# Patient Record
Sex: Male | Born: 1960
Health system: Southern US, Community
[De-identification: ages and names within clinical notes are randomized; demographics above are authoritative.]

## PROBLEM LIST (undated history)

## (undated) DIAGNOSIS — L918 Other hypertrophic disorders of the skin: Secondary | ICD-10-CM

## (undated) DIAGNOSIS — Q059 Spina bifida, unspecified: Secondary | ICD-10-CM

## (undated) DIAGNOSIS — I10 Essential (primary) hypertension: Secondary | ICD-10-CM

## (undated) DIAGNOSIS — Z8781 Personal history of (healed) traumatic fracture: Secondary | ICD-10-CM

## (undated) DIAGNOSIS — K219 Gastro-esophageal reflux disease without esophagitis: Secondary | ICD-10-CM

## (undated) DIAGNOSIS — R230 Cyanosis: Secondary | ICD-10-CM

## (undated) DIAGNOSIS — N390 Urinary tract infection, site not specified: Secondary | ICD-10-CM

## (undated) DIAGNOSIS — I7090 Unspecified atherosclerosis: Secondary | ICD-10-CM

## (undated) DIAGNOSIS — R209 Unspecified disturbances of skin sensation: Secondary | ICD-10-CM

## (undated) DIAGNOSIS — K824 Cholesterolosis of gallbladder: Secondary | ICD-10-CM

## (undated) DIAGNOSIS — Z789 Other specified health status: Secondary | ICD-10-CM

## (undated) DIAGNOSIS — R413 Other amnesia: Secondary | ICD-10-CM

## (undated) DIAGNOSIS — N319 Neuromuscular dysfunction of bladder, unspecified: Secondary | ICD-10-CM

## (undated) DIAGNOSIS — Z8669 Personal history of other diseases of the nervous system and sense organs: Secondary | ICD-10-CM

## (undated) HISTORY — PX: UPPER GASTROINTESTINAL ENDOSCOPY: SHX188

## (undated) HISTORY — DX: Urinary tract infection, site not specified: N39.0

## (undated) HISTORY — DX: Personal history of other diseases of the nervous system and sense organs: Z86.69

## (undated) HISTORY — DX: Neuromuscular dysfunction of bladder, unspecified: N31.9

## (undated) HISTORY — DX: Cyanosis: R23.0

## (undated) HISTORY — DX: Spina bifida, unspecified: Q05.9

## (undated) HISTORY — PX: CSF SHUNT: SHX92

## (undated) HISTORY — PX: OTHER SURGICAL HISTORY: SHX169

## (undated) HISTORY — DX: Essential (primary) hypertension: I10

## (undated) HISTORY — DX: Other hypertrophic disorders of the skin: L91.8

## (undated) HISTORY — DX: Gastro-esophageal reflux disease without esophagitis: K21.9

## (undated) HISTORY — DX: Other amnesia: R41.3

## (undated) HISTORY — DX: Unspecified atherosclerosis: I70.90

## (undated) HISTORY — DX: Cholesterolosis of gallbladder: K82.4

## (undated) HISTORY — DX: Unspecified disturbances of skin sensation: R20.9

---

## 1999-01-08 HISTORY — PX: COLONOSCOPY: SHX174

## 1999-12-26 ENCOUNTER — Ambulatory Visit (HOSPITAL_COMMUNITY): Admission: RE | Admit: 1999-12-26 | Discharge: 1999-12-26 | Payer: Self-pay | Admitting: *Deleted

## 2008-03-10 ENCOUNTER — Encounter (INDEPENDENT_AMBULATORY_CARE_PROVIDER_SITE_OTHER): Payer: Self-pay | Admitting: *Deleted

## 2008-03-10 ENCOUNTER — Ambulatory Visit (HOSPITAL_COMMUNITY): Admission: RE | Admit: 2008-03-10 | Discharge: 2008-03-10 | Payer: Self-pay | Admitting: *Deleted

## 2009-05-26 ENCOUNTER — Encounter: Admission: RE | Admit: 2009-05-26 | Discharge: 2009-05-26 | Payer: Self-pay | Admitting: Internal Medicine

## 2009-09-28 ENCOUNTER — Encounter: Admission: RE | Admit: 2009-09-28 | Discharge: 2009-09-28 | Payer: Self-pay | Admitting: Internal Medicine

## 2010-05-22 NOTE — Op Note (Signed)
NAME:  Kenneth Arroyo, Kenneth Arroyo NO.:  [id number]   MEDICAL RECORD NO.:  [id number]          PATIENT TYPE:  AMB   LOCATION:  ENDO                         FACILITY:  [facility]   PHYSICIAN:  Georgiana Spinner, M.D.    DATE OF BIRTH:  [birth year]   DATE OF PROCEDURE:  03/10/2008  DATE OF DISCHARGE:                               OPERATIVE REPORT   PROCEDURE:  Upper endoscopy.   INDICATIONS:  Abdominal pain.   ANESTHESIA:  Fentanyl 75 mcg, Versed 6 mg.   PROCEDURE:  With the patient mildly sedated in the left lateral  decubitus position the Pentax videoscopic endoscope was inserted in the  mouth and passed under direct vision through the esophagus which  appeared normal until we reached distal esophagus and at the junction of  the squamocolumnar border, we saw a small ulcer.  We entered into the  stomach.  The fundus, body, antrum, duodenal bulb, second portion  duodenum were visualized.  From this point the endoscope was slowly  withdrawn taking circumferential views of duodenal mucosa until the  endoscope had been pulled back into stomach, placed in retroflexion to  view the stomach from below.  The endoscope was straightened and  withdrawn taking circumferential views remaining gastric and esophageal  mucosa stopping in the antrum to take biopsies of mildly erythematous  mucosa and also in the fundus of the stomach where a polyp was seen,  photographed and it, too, was biopsied.  We then withdrew back to the  distal esophagus, proximal stomach where the previously noted ulcer was  seen photographed and biopsied.  The endoscope was withdrawn taking  circumferential views of remaining esophageal mucosa.  The patient's  vital signs, pulse oximeter remained stable.  The patient tolerated  procedure well without apparent complications.   FINDINGS:  Mild changes of duodenitis photographed only.  Question of  antritis biopsied.  Gastric polyp biopsied and an ulcer at the  squamocolumnar  junction biopsied.  Await biopsy reports.  The patient  will call me for results and follow-up with me as an outpatient.  Will  start the patient on PPI therapy namely Prilosec OTC once a day.  The  patient states his abdominal pain is somewhat better and will follow up  subsequently.           ______________________________  Georgiana Spinner, M.D.     GMO/MEDQ  D:  03/10/2008  T:  03/10/2008  Job:  [id number]

## 2011-01-11 DIAGNOSIS — B0223 Postherpetic polyneuropathy: Secondary | ICD-10-CM | POA: Diagnosis not present

## 2011-01-11 DIAGNOSIS — H35719 Central serous chorioretinopathy, unspecified eye: Secondary | ICD-10-CM | POA: Diagnosis not present

## 2011-01-16 DIAGNOSIS — D485 Neoplasm of uncertain behavior of skin: Secondary | ICD-10-CM | POA: Diagnosis not present

## 2011-01-16 DIAGNOSIS — L259 Unspecified contact dermatitis, unspecified cause: Secondary | ICD-10-CM | POA: Diagnosis not present

## 2011-02-21 DIAGNOSIS — J019 Acute sinusitis, unspecified: Secondary | ICD-10-CM | POA: Diagnosis not present

## 2011-03-12 DIAGNOSIS — H359 Unspecified retinal disorder: Secondary | ICD-10-CM | POA: Diagnosis not present

## 2011-03-12 DIAGNOSIS — H35719 Central serous chorioretinopathy, unspecified eye: Secondary | ICD-10-CM | POA: Diagnosis not present

## 2011-05-21 DIAGNOSIS — H35719 Central serous chorioretinopathy, unspecified eye: Secondary | ICD-10-CM | POA: Diagnosis not present

## 2011-07-12 DIAGNOSIS — I10 Essential (primary) hypertension: Secondary | ICD-10-CM | POA: Diagnosis not present

## 2011-07-12 DIAGNOSIS — R300 Dysuria: Secondary | ICD-10-CM | POA: Diagnosis not present

## 2011-07-12 DIAGNOSIS — N390 Urinary tract infection, site not specified: Secondary | ICD-10-CM | POA: Diagnosis not present

## 2011-07-19 DIAGNOSIS — N390 Urinary tract infection, site not specified: Secondary | ICD-10-CM | POA: Diagnosis not present

## 2011-07-19 DIAGNOSIS — I10 Essential (primary) hypertension: Secondary | ICD-10-CM | POA: Diagnosis not present

## 2011-07-23 DIAGNOSIS — H35719 Central serous chorioretinopathy, unspecified eye: Secondary | ICD-10-CM | POA: Diagnosis not present

## 2011-07-25 DIAGNOSIS — N390 Urinary tract infection, site not specified: Secondary | ICD-10-CM | POA: Diagnosis not present

## 2011-08-02 DIAGNOSIS — I10 Essential (primary) hypertension: Secondary | ICD-10-CM | POA: Diagnosis not present

## 2011-08-21 DIAGNOSIS — N390 Urinary tract infection, site not specified: Secondary | ICD-10-CM | POA: Diagnosis not present

## 2011-08-21 DIAGNOSIS — R5383 Other fatigue: Secondary | ICD-10-CM | POA: Diagnosis not present

## 2011-08-21 DIAGNOSIS — R5381 Other malaise: Secondary | ICD-10-CM | POA: Diagnosis not present

## 2011-09-04 DIAGNOSIS — N3020 Other chronic cystitis without hematuria: Secondary | ICD-10-CM | POA: Diagnosis not present

## 2011-09-04 DIAGNOSIS — N319 Neuromuscular dysfunction of bladder, unspecified: Secondary | ICD-10-CM | POA: Diagnosis not present

## 2011-09-27 DIAGNOSIS — N319 Neuromuscular dysfunction of bladder, unspecified: Secondary | ICD-10-CM | POA: Diagnosis not present

## 2011-09-27 DIAGNOSIS — N3020 Other chronic cystitis without hematuria: Secondary | ICD-10-CM | POA: Diagnosis not present

## 2011-10-28 DIAGNOSIS — Z79899 Other long term (current) drug therapy: Secondary | ICD-10-CM | POA: Diagnosis not present

## 2011-10-28 DIAGNOSIS — K229 Disease of esophagus, unspecified: Secondary | ICD-10-CM | POA: Diagnosis not present

## 2011-10-28 DIAGNOSIS — I10 Essential (primary) hypertension: Secondary | ICD-10-CM | POA: Diagnosis not present

## 2011-10-28 DIAGNOSIS — N390 Urinary tract infection, site not specified: Secondary | ICD-10-CM | POA: Diagnosis not present

## 2011-11-01 ENCOUNTER — Other Ambulatory Visit: Payer: Self-pay | Admitting: Internal Medicine

## 2011-11-01 DIAGNOSIS — Q059 Spina bifida, unspecified: Secondary | ICD-10-CM | POA: Diagnosis not present

## 2011-11-01 DIAGNOSIS — L570 Actinic keratosis: Secondary | ICD-10-CM | POA: Diagnosis not present

## 2011-11-01 DIAGNOSIS — N319 Neuromuscular dysfunction of bladder, unspecified: Secondary | ICD-10-CM | POA: Diagnosis not present

## 2011-11-01 DIAGNOSIS — K219 Gastro-esophageal reflux disease without esophagitis: Secondary | ICD-10-CM | POA: Diagnosis not present

## 2011-11-01 DIAGNOSIS — R230 Cyanosis: Secondary | ICD-10-CM

## 2011-11-01 DIAGNOSIS — I10 Essential (primary) hypertension: Secondary | ICD-10-CM | POA: Diagnosis not present

## 2011-11-08 DIAGNOSIS — I10 Essential (primary) hypertension: Secondary | ICD-10-CM | POA: Diagnosis not present

## 2011-11-08 DIAGNOSIS — R209 Unspecified disturbances of skin sensation: Secondary | ICD-10-CM

## 2011-11-08 HISTORY — DX: Unspecified disturbances of skin sensation: R20.9

## 2011-11-12 ENCOUNTER — Ambulatory Visit
Admission: RE | Admit: 2011-11-12 | Discharge: 2011-11-12 | Disposition: A | Discharge status: Home / Self Care | Payer: Medicare Other | Source: Ambulatory Visit | Attending: Internal Medicine | Admitting: Internal Medicine

## 2011-11-12 DIAGNOSIS — I70229 Atherosclerosis of native arteries of extremities with rest pain, unspecified extremity: Secondary | ICD-10-CM | POA: Diagnosis not present

## 2011-11-12 DIAGNOSIS — R230 Cyanosis: Secondary | ICD-10-CM

## 2011-11-20 DIAGNOSIS — I10 Essential (primary) hypertension: Secondary | ICD-10-CM | POA: Diagnosis not present

## 2011-12-09 ENCOUNTER — Encounter: Payer: Self-pay | Admitting: Vascular Surgery

## 2011-12-17 ENCOUNTER — Encounter: Payer: Self-pay | Admitting: Vascular Surgery

## 2011-12-18 ENCOUNTER — Encounter: Payer: Medicare Other | Admitting: Vascular Surgery

## 2011-12-18 ENCOUNTER — Encounter: Payer: Self-pay | Admitting: Vascular Surgery

## 2011-12-18 ENCOUNTER — Ambulatory Visit (INDEPENDENT_AMBULATORY_CARE_PROVIDER_SITE_OTHER): Payer: Medicare Other | Admitting: Vascular Surgery

## 2011-12-18 VITALS — BP 120/79 | HR 58 | Ht 60.0 in | Wt 170.0 lb

## 2011-12-18 DIAGNOSIS — I739 Peripheral vascular disease, unspecified: Secondary | ICD-10-CM | POA: Diagnosis not present

## 2011-12-18 NOTE — Assessment & Plan Note (Signed)
Based on his Doppler study he does have evidence of bilateral infrainguinal arterial occlusive disease. However, he has only moderate disease and has no wounds on his feet. He is nonambulatory with no sensation in his legs. I would only favor arteriography and consideration for revascularization in his situation if he developed a nonhealing wound on his foot. I've encouraged him to continue taking care of his feet and pulled him that we will see him back at any time if any new vascular issues arise.

## 2011-12-18 NOTE — Progress Notes (Signed)
Vascular and Vein Specialist of [facility]  Patient name: Kenneth Arroyo MRN: [id number] DOB: 10/29/1960 Sex: male  REASON FOR CONSULT: evaluate for peripheral vascular disease  HPI: Kenneth Arroyo is a 51 y.o. male with a history of spina bifida. He is paraplegic. He has no sensation in his legs and is nonambulatory. He has had no history of wounds on his feet. He was noted to have some mottling in his lower extremities and therefore was sent for vascular consultation.  I have reviewed his records from Dr. Carolee Rota office. He does have a history of neurogenic bladder and frequent urinary tract infections. He also has essential hypertension which has been under good control and gastroesophageal reflux disease.  Past Medical History  Diagnosis Date  . Arterial occlusive disease   . Neurogenic bladder, NOS   . Esophageal reflux   . Hypertension   . Spina bifida without mention of hydrocephalus, unspecified region   . UTI (lower urinary tract infection)   . Cyanosis   . Cutaneous skin tags   . History of paraplegia     Secondary to spina bifida   . Cold feet Nov 2013    Bilateral cold feet , discolored.  [place] bladder polyp     Family History  Problem Relation Age of Onset  . Alzheimer's disease Mother   . Hyperlipidemia Mother   . Other Sister     varicose veins    SOCIAL HISTORY: History  Substance Use Topics  . Smoking status: Never Smoker   . Smokeless tobacco: Not on file  . Alcohol Use: No    Allergies  Allergen Reactions  . Amlodipine Swelling  . Amoxicillin Rash  . Doxycycline Rash  . Sulfa Antibiotics Rash    Current Outpatient Prescriptions  Medication Sig Dispense Refill  . atenolol (TENORMIN) 25 MG tablet       . cefUROXime (CEFTIN) 250 MG tablet Take 250 mg by mouth 2 (two) times daily.      . hydrochlorothiazide (HYDRODIURIL) 25 MG tablet       . losartan (COZAAR) 100 MG tablet       . omeprazole (PRILOSEC) 20 MG capsule       . nitrofurantoin  (FURADANTIN) 25 MG/5ML suspension Take 50 mg by mouth 4 (four) times daily.      . nitrofurantoin (MACRODANTIN) 100 MG capsule Take 100 mg by mouth 4 (four) times daily.        REVIEW OF SYSTEMS: Arly.Keller ] denotes positive finding; [  ] denotes negative finding  CARDIOVASCULAR:  [ ]  chest pain   [ ]  chest pressure   [ ]  palpitations   [ ]  orthopnea   [ ]  dyspnea on exertion   [ ]  claudication   [ ]  rest pain   [ ]  DVT   [ ]  phlebitis PULMONARY:   [ ]  productive cough   [ ]  asthma   [ ]  wheezing NEUROLOGIC:   [ ]  weakness  [ ]  paresthesias  [ ]  aphasia  [ ]  amaurosis  [ ]  dizziness HEMATOLOGIC:   [ ]  bleeding problems   [ ]  clotting disorders MUSCULOSKELETAL:  [ ]  joint pain   [ ]  joint swelling Arly.Keller ] leg swelling GASTROINTESTINAL: [ ]   blood in stool  [ ]   hematemesis GENITOURINARY:  [ ]   dysuria  [ ]   hematuria PSYCHIATRIC:  [ ]  history of major depression INTEGUMENTARY:  [ ]  rashes  [ ]  ulcers CONSTITUTIONAL:  [ ]  fever   [ ]   chills  PHYSICAL EXAM: Filed Vitals:   12/18/11 0959  BP: 120/79  Pulse: 58  Height: 5' (1.524 m)  Weight: 170 lb (77.111 kg)  SpO2: 95%   Body mass index is 33.20 kg/(m^2). GENERAL: The patient is a well-nourished male, in no acute distress. The vital signs are documented above. CARDIOVASCULAR: There is a regular rate and rhythm. I do not detect carotid bruits. He has palpable femoral pulses. I cannot palpate pedal pulses. His feet appear adequately perfused. Both feet are somewhat cool. Currently he has no significant lower extremity swelling. PULMONARY: There is good air exchange bilaterally without wheezing or rales. ABDOMEN: Soft and non-tender with normal pitched bowel sounds.  MUSCULOSKELETAL: There are no major deformities or cyanosis. NEUROLOGIC: No focal weakness or paresthesias are detected. SKIN: There are no ulcers or rashes noted. PSYCHIATRIC: The patient has a normal affect.  DATA:  I have reviewed his arterial Doppler study that was done on  11/12/2011. This shows triphasic waveforms in both femoral arteries. He has an ABI of 74% on the right and 82% on the left.  MEDICAL ISSUES: Peripheral vascular disease, unspecified Based on his Doppler study he does have evidence of bilateral infrainguinal arterial occlusive disease. However, he has only moderate disease and has no wounds on his feet. He is nonambulatory with no sensation in his legs. I would only favor arteriography and consideration for revascularization in his situation if he developed a nonhealing wound on his foot. I've encouraged him to continue taking care of his feet and pulled him that we will see him back at any time if any new vascular issues arise.   Corinna Burkman S [facility] Vein Specialists of [place] [facility]: [phone]

## 2012-03-18 DIAGNOSIS — J329 Chronic sinusitis, unspecified: Secondary | ICD-10-CM | POA: Diagnosis not present

## 2012-04-02 DIAGNOSIS — N319 Neuromuscular dysfunction of bladder, unspecified: Secondary | ICD-10-CM | POA: Diagnosis not present

## 2012-04-02 DIAGNOSIS — N3020 Other chronic cystitis without hematuria: Secondary | ICD-10-CM | POA: Diagnosis not present

## 2012-05-12 DIAGNOSIS — M7710 Lateral epicondylitis, unspecified elbow: Secondary | ICD-10-CM | POA: Diagnosis not present

## 2012-05-12 DIAGNOSIS — R079 Chest pain, unspecified: Secondary | ICD-10-CM | POA: Diagnosis not present

## 2012-05-12 DIAGNOSIS — I10 Essential (primary) hypertension: Secondary | ICD-10-CM | POA: Diagnosis not present

## 2012-08-10 DIAGNOSIS — L255 Unspecified contact dermatitis due to plants, except food: Secondary | ICD-10-CM | POA: Diagnosis not present

## 2013-05-18 DIAGNOSIS — L259 Unspecified contact dermatitis, unspecified cause: Secondary | ICD-10-CM | POA: Diagnosis not present

## 2013-08-19 DIAGNOSIS — J069 Acute upper respiratory infection, unspecified: Secondary | ICD-10-CM | POA: Diagnosis not present

## 2013-08-19 DIAGNOSIS — R079 Chest pain, unspecified: Secondary | ICD-10-CM | POA: Diagnosis not present

## 2013-09-15 ENCOUNTER — Other Ambulatory Visit (HOSPITAL_COMMUNITY): Payer: Self-pay | Admitting: Adult Health

## 2013-09-15 ENCOUNTER — Ambulatory Visit (HOSPITAL_COMMUNITY)
Admission: RE | Admit: 2013-09-15 | Discharge: 2013-09-15 | Disposition: A | Discharge status: Home / Self Care | Payer: Medicare Other | Source: Ambulatory Visit | Attending: Adult Health | Admitting: Adult Health

## 2013-09-15 DIAGNOSIS — N3020 Other chronic cystitis without hematuria: Secondary | ICD-10-CM | POA: Diagnosis not present

## 2013-09-15 DIAGNOSIS — M439 Deforming dorsopathy, unspecified: Secondary | ICD-10-CM | POA: Diagnosis not present

## 2013-09-15 DIAGNOSIS — R0781 Pleurodynia: Secondary | ICD-10-CM

## 2013-09-15 DIAGNOSIS — R079 Chest pain, unspecified: Secondary | ICD-10-CM | POA: Diagnosis not present

## 2013-09-15 DIAGNOSIS — I517 Cardiomegaly: Secondary | ICD-10-CM | POA: Insufficient documentation

## 2013-09-15 DIAGNOSIS — Q766 Other congenital malformations of ribs: Secondary | ICD-10-CM | POA: Insufficient documentation

## 2013-09-15 DIAGNOSIS — Q767 Congenital malformation of sternum: Secondary | ICD-10-CM | POA: Insufficient documentation

## 2013-09-15 DIAGNOSIS — N319 Neuromuscular dysfunction of bladder, unspecified: Secondary | ICD-10-CM | POA: Diagnosis not present

## 2013-10-08 DIAGNOSIS — N3020 Other chronic cystitis without hematuria: Secondary | ICD-10-CM | POA: Diagnosis not present

## 2013-10-08 DIAGNOSIS — N319 Neuromuscular dysfunction of bladder, unspecified: Secondary | ICD-10-CM | POA: Diagnosis not present

## 2013-11-10 DIAGNOSIS — Z125 Encounter for screening for malignant neoplasm of prostate: Secondary | ICD-10-CM | POA: Diagnosis not present

## 2013-11-10 DIAGNOSIS — I10 Essential (primary) hypertension: Secondary | ICD-10-CM | POA: Diagnosis not present

## 2013-11-16 DIAGNOSIS — E780 Pure hypercholesterolemia: Secondary | ICD-10-CM | POA: Diagnosis not present

## 2013-11-16 DIAGNOSIS — E786 Lipoprotein deficiency: Secondary | ICD-10-CM | POA: Diagnosis not present

## 2013-11-16 DIAGNOSIS — R7301 Impaired fasting glucose: Secondary | ICD-10-CM | POA: Diagnosis not present

## 2013-11-16 DIAGNOSIS — I10 Essential (primary) hypertension: Secondary | ICD-10-CM | POA: Diagnosis not present

## 2013-11-16 DIAGNOSIS — J329 Chronic sinusitis, unspecified: Secondary | ICD-10-CM | POA: Diagnosis not present

## 2013-11-19 DIAGNOSIS — E876 Hypokalemia: Secondary | ICD-10-CM | POA: Diagnosis not present

## 2013-11-26 DIAGNOSIS — E876 Hypokalemia: Secondary | ICD-10-CM | POA: Diagnosis not present

## 2014-05-17 DIAGNOSIS — E780 Pure hypercholesterolemia: Secondary | ICD-10-CM | POA: Diagnosis not present

## 2014-05-23 DIAGNOSIS — R748 Abnormal levels of other serum enzymes: Secondary | ICD-10-CM | POA: Diagnosis not present

## 2014-05-23 DIAGNOSIS — I10 Essential (primary) hypertension: Secondary | ICD-10-CM | POA: Diagnosis not present

## 2014-05-23 DIAGNOSIS — L309 Dermatitis, unspecified: Secondary | ICD-10-CM | POA: Diagnosis not present

## 2014-10-11 DIAGNOSIS — N319 Neuromuscular dysfunction of bladder, unspecified: Secondary | ICD-10-CM | POA: Diagnosis not present

## 2014-10-11 DIAGNOSIS — N3020 Other chronic cystitis without hematuria: Secondary | ICD-10-CM | POA: Diagnosis not present

## 2014-11-16 DIAGNOSIS — Z125 Encounter for screening for malignant neoplasm of prostate: Secondary | ICD-10-CM | POA: Diagnosis not present

## 2014-11-16 DIAGNOSIS — N390 Urinary tract infection, site not specified: Secondary | ICD-10-CM | POA: Diagnosis not present

## 2014-11-16 DIAGNOSIS — E7800 Pure hypercholesterolemia, unspecified: Secondary | ICD-10-CM | POA: Diagnosis not present

## 2014-11-16 DIAGNOSIS — Z0001 Encounter for general adult medical examination with abnormal findings: Secondary | ICD-10-CM | POA: Diagnosis not present

## 2014-11-16 DIAGNOSIS — I10 Essential (primary) hypertension: Secondary | ICD-10-CM | POA: Diagnosis not present

## 2014-11-22 DIAGNOSIS — I10 Essential (primary) hypertension: Secondary | ICD-10-CM | POA: Diagnosis not present

## 2014-11-22 DIAGNOSIS — H6120 Impacted cerumen, unspecified ear: Secondary | ICD-10-CM | POA: Diagnosis not present

## 2014-11-22 DIAGNOSIS — Z0000 Encounter for general adult medical examination without abnormal findings: Secondary | ICD-10-CM | POA: Diagnosis not present

## 2014-11-22 DIAGNOSIS — E876 Hypokalemia: Secondary | ICD-10-CM | POA: Diagnosis not present

## 2014-11-22 DIAGNOSIS — L570 Actinic keratosis: Secondary | ICD-10-CM | POA: Diagnosis not present

## 2014-11-22 DIAGNOSIS — N319 Neuromuscular dysfunction of bladder, unspecified: Secondary | ICD-10-CM | POA: Diagnosis not present

## 2014-11-22 DIAGNOSIS — K219 Gastro-esophageal reflux disease without esophagitis: Secondary | ICD-10-CM | POA: Diagnosis not present

## 2014-12-06 DIAGNOSIS — I10 Essential (primary) hypertension: Secondary | ICD-10-CM | POA: Diagnosis not present

## 2014-12-06 DIAGNOSIS — E876 Hypokalemia: Secondary | ICD-10-CM | POA: Diagnosis not present

## 2015-05-29 DIAGNOSIS — J329 Chronic sinusitis, unspecified: Secondary | ICD-10-CM | POA: Diagnosis not present

## 2015-05-29 DIAGNOSIS — I10 Essential (primary) hypertension: Secondary | ICD-10-CM | POA: Diagnosis not present

## 2015-12-29 DIAGNOSIS — I10 Essential (primary) hypertension: Secondary | ICD-10-CM | POA: Diagnosis not present

## 2015-12-29 DIAGNOSIS — Z125 Encounter for screening for malignant neoplasm of prostate: Secondary | ICD-10-CM | POA: Diagnosis not present

## 2015-12-29 DIAGNOSIS — N390 Urinary tract infection, site not specified: Secondary | ICD-10-CM | POA: Diagnosis not present

## 2015-12-29 DIAGNOSIS — Z0001 Encounter for general adult medical examination with abnormal findings: Secondary | ICD-10-CM | POA: Diagnosis not present

## 2015-12-29 DIAGNOSIS — E7800 Pure hypercholesterolemia, unspecified: Secondary | ICD-10-CM | POA: Diagnosis not present

## 2016-01-04 DIAGNOSIS — I10 Essential (primary) hypertension: Secondary | ICD-10-CM | POA: Diagnosis not present

## 2016-01-04 DIAGNOSIS — K219 Gastro-esophageal reflux disease without esophagitis: Secondary | ICD-10-CM | POA: Diagnosis not present

## 2016-01-04 DIAGNOSIS — N390 Urinary tract infection, site not specified: Secondary | ICD-10-CM | POA: Diagnosis not present

## 2016-01-04 DIAGNOSIS — K089 Disorder of teeth and supporting structures, unspecified: Secondary | ICD-10-CM | POA: Diagnosis not present

## 2016-04-02 DIAGNOSIS — J329 Chronic sinusitis, unspecified: Secondary | ICD-10-CM | POA: Diagnosis not present

## 2016-04-15 DIAGNOSIS — L03012 Cellulitis of left finger: Secondary | ICD-10-CM | POA: Diagnosis not present

## 2016-04-19 DIAGNOSIS — L03012 Cellulitis of left finger: Secondary | ICD-10-CM | POA: Diagnosis not present

## 2016-04-26 DIAGNOSIS — L03012 Cellulitis of left finger: Secondary | ICD-10-CM | POA: Diagnosis not present

## 2016-06-18 DIAGNOSIS — M2550 Pain in unspecified joint: Secondary | ICD-10-CM | POA: Diagnosis not present

## 2016-06-18 DIAGNOSIS — Z9359 Other cystostomy status: Secondary | ICD-10-CM | POA: Diagnosis not present

## 2016-06-18 DIAGNOSIS — F329 Major depressive disorder, single episode, unspecified: Secondary | ICD-10-CM | POA: Diagnosis not present

## 2016-06-18 DIAGNOSIS — R5383 Other fatigue: Secondary | ICD-10-CM | POA: Diagnosis not present

## 2016-07-05 DIAGNOSIS — I10 Essential (primary) hypertension: Secondary | ICD-10-CM | POA: Diagnosis not present

## 2016-07-05 DIAGNOSIS — K219 Gastro-esophageal reflux disease without esophagitis: Secondary | ICD-10-CM | POA: Diagnosis not present

## 2016-07-05 DIAGNOSIS — E039 Hypothyroidism, unspecified: Secondary | ICD-10-CM | POA: Diagnosis not present

## 2016-09-02 DIAGNOSIS — R5383 Other fatigue: Secondary | ICD-10-CM | POA: Diagnosis not present

## 2016-11-05 DIAGNOSIS — S5002XA Contusion of left elbow, initial encounter: Secondary | ICD-10-CM | POA: Diagnosis not present

## 2016-11-05 DIAGNOSIS — M25522 Pain in left elbow: Secondary | ICD-10-CM | POA: Diagnosis not present

## 2016-11-11 DIAGNOSIS — M25522 Pain in left elbow: Secondary | ICD-10-CM | POA: Diagnosis not present

## 2017-01-02 DIAGNOSIS — I10 Essential (primary) hypertension: Secondary | ICD-10-CM | POA: Diagnosis not present

## 2017-01-02 DIAGNOSIS — E7800 Pure hypercholesterolemia, unspecified: Secondary | ICD-10-CM | POA: Diagnosis not present

## 2017-01-02 DIAGNOSIS — Z125 Encounter for screening for malignant neoplasm of prostate: Secondary | ICD-10-CM | POA: Diagnosis not present

## 2017-01-02 DIAGNOSIS — N390 Urinary tract infection, site not specified: Secondary | ICD-10-CM | POA: Diagnosis not present

## 2017-01-03 DIAGNOSIS — E7800 Pure hypercholesterolemia, unspecified: Secondary | ICD-10-CM | POA: Diagnosis not present

## 2017-01-03 DIAGNOSIS — Z125 Encounter for screening for malignant neoplasm of prostate: Secondary | ICD-10-CM | POA: Diagnosis not present

## 2017-01-03 DIAGNOSIS — I10 Essential (primary) hypertension: Secondary | ICD-10-CM | POA: Diagnosis not present

## 2017-01-06 DIAGNOSIS — Q059 Spina bifida, unspecified: Secondary | ICD-10-CM | POA: Diagnosis not present

## 2017-01-06 DIAGNOSIS — E7800 Pure hypercholesterolemia, unspecified: Secondary | ICD-10-CM | POA: Diagnosis not present

## 2017-01-06 DIAGNOSIS — I10 Essential (primary) hypertension: Secondary | ICD-10-CM | POA: Diagnosis not present

## 2017-01-06 DIAGNOSIS — N390 Urinary tract infection, site not specified: Secondary | ICD-10-CM | POA: Diagnosis not present

## 2017-01-06 DIAGNOSIS — K219 Gastro-esophageal reflux disease without esophagitis: Secondary | ICD-10-CM | POA: Diagnosis not present

## 2017-01-06 DIAGNOSIS — N281 Cyst of kidney, acquired: Secondary | ICD-10-CM | POA: Diagnosis not present

## 2017-01-06 DIAGNOSIS — R197 Diarrhea, unspecified: Secondary | ICD-10-CM | POA: Diagnosis not present

## 2017-01-06 DIAGNOSIS — L304 Erythema intertrigo: Secondary | ICD-10-CM | POA: Diagnosis not present

## 2017-01-06 DIAGNOSIS — I739 Peripheral vascular disease, unspecified: Secondary | ICD-10-CM | POA: Diagnosis not present

## 2017-01-06 DIAGNOSIS — N319 Neuromuscular dysfunction of bladder, unspecified: Secondary | ICD-10-CM | POA: Diagnosis not present

## 2017-01-06 DIAGNOSIS — E039 Hypothyroidism, unspecified: Secondary | ICD-10-CM | POA: Diagnosis not present

## 2017-01-08 DIAGNOSIS — I10 Essential (primary) hypertension: Secondary | ICD-10-CM | POA: Diagnosis not present

## 2017-01-08 DIAGNOSIS — E039 Hypothyroidism, unspecified: Secondary | ICD-10-CM | POA: Diagnosis not present

## 2017-01-08 DIAGNOSIS — E7800 Pure hypercholesterolemia, unspecified: Secondary | ICD-10-CM | POA: Diagnosis not present

## 2017-01-10 ENCOUNTER — Other Ambulatory Visit: Payer: Self-pay

## 2017-01-10 NOTE — Patient Outreach (Signed)
[place] [facility]) Care Management  01/10/2017  Kenneth Arroyo May 08, 1960 [id number]     Telephone Screen  Referral Date: 01/09/17 Referral Source: MD office (Dr. Shelia Media) Referral Reason: "social worker and possible nursing, DME-motorized wheelchair, possible SCAT services" Insurance: Medicare    Outreach attempt # 1 to patient. No answer. RN CM left HIPAA compliant voicemail message along with contact info.    Plan: RN CM will make outreach attempt to patient within three days if no return call from patient.   Enzo Montgomery, RN,BSN,CCM [place] Management Telephonic Care Management Coordinator Direct Phone: [phone] Toll Free: [phone] Fax: [phone]

## 2017-01-13 ENCOUNTER — Other Ambulatory Visit: Payer: Self-pay

## 2017-01-13 NOTE — Patient Outreach (Signed)
[place] [facility]) Care Management  01/13/2017  Kenneth Arroyo Feb 23, 1960 [id number]   Telephone Screen  Referral Date: 01/09/17 Referral Source: MD office (Dr. Shelia Media) Referral Reason: "social worker and possible nursing, DME-motorized wheelchair, possible SCAT services" Insurance: Medicare    Outreach attempt #2 to patient. No answer at present. RN CM left HIPAA compliant voicemail message along with contact info.    Plan: RN CM will make outreach attempt to patient within three business days.     Enzo Montgomery, RN,BSN,CCM [place] Management Telephonic Care Management Coordinator Direct Phone: [phone] Toll Free: [phone] Fax: [phone]

## 2017-01-15 ENCOUNTER — Ambulatory Visit: Payer: Self-pay

## 2017-01-15 ENCOUNTER — Other Ambulatory Visit: Payer: Self-pay

## 2017-01-15 NOTE — Patient Outreach (Signed)
[place] [facility]) Care Management  01/15/2017  Kenneth Arroyo 08-Jan-1961 [id number]     Telephone Screen  Referral Date:01/09/17 Referral Source:MD office (Dr. Shelia Media) Referral Reason:"social worker and possible nursing, DME-motorized wheelchair, possible SCAT services" Insurance:Medicare   Outreach attempt #3 to patient. No answer at present.     Plan: RN CM will send unsuccessful outreach letter to patient and close case if no response from patient within 10 business days.   Enzo Montgomery, RN,BSN,CCM [place] Management Telephonic Care Management Coordinator Direct Phone: [phone] Toll Free: [phone] Fax: [phone]

## 2017-01-16 DIAGNOSIS — R197 Diarrhea, unspecified: Secondary | ICD-10-CM | POA: Diagnosis not present

## 2017-01-29 ENCOUNTER — Other Ambulatory Visit: Payer: Self-pay

## 2017-01-29 NOTE — Patient Outreach (Signed)
[place] [facility]) Care Management  01/29/2017  AURTHER HARLIN [birth year] [id number]   Telephone Screen  Referral Date:01/09/17 Referral Source:MD office (Dr. Shelia Media) Referral Reason:"social worker and possible nursing, DME-motorized wheelchair, possible SCAT services" Insurance:Medicare   Multiple attempts to establish contact with patient without success. No response from letter mailed to patient. Case is being closed at this time.     Plan: RN CM will notify [facility] administrative assistant of case status. RN CM will send case closure letter to MD.   Enzo Montgomery, RN,BSN,CCM [place] Management Telephonic Care Management Coordinator Direct Phone: [phone] Toll Free: [phone] Fax: [phone]

## 2017-02-18 ENCOUNTER — Other Ambulatory Visit: Payer: Self-pay

## 2017-02-18 DIAGNOSIS — E039 Hypothyroidism, unspecified: Secondary | ICD-10-CM | POA: Diagnosis not present

## 2017-02-18 NOTE — Patient Outreach (Signed)
[place]) Care Management  02/18/2017  SPYROS WINCH [birth year] [id number]   Care Coordination    RN CM received voicemail message from [facility] at Dr. Pennie Banter office requesting a call back. Return call placed to MD office and spoke with Vickie. She was inquiring about update on referral status. Advised staff that RN CM attempted three phone calls and sent letter with no response from patient and case was closed out on 01/29/17. She voices that patient is coming into office this week and wants to know if it would be okay to give patient RN CM contact info to call. Advised that patient could call RN CM back and referral could be re-opened. She will discuss this with patient during appt. Also, reviewed with staff outreach process and that we can only make three phone attempts to reach patient and therefore it is imperative that patient respond to voicemail messages left and return call. She voiced understanding and will share this info with patient.    Enzo Montgomery, RN,BSN,CCM [place] Management Telephonic Care Management Coordinator Direct Phone: [phone] Toll Free: [phone] Fax: [phone]

## 2017-02-20 DIAGNOSIS — E039 Hypothyroidism, unspecified: Secondary | ICD-10-CM | POA: Diagnosis not present

## 2017-02-20 DIAGNOSIS — D2262 Melanocytic nevi of left upper limb, including shoulder: Secondary | ICD-10-CM | POA: Diagnosis not present

## 2017-02-20 DIAGNOSIS — L918 Other hypertrophic disorders of the skin: Secondary | ICD-10-CM | POA: Diagnosis not present

## 2017-02-20 DIAGNOSIS — I781 Nevus, non-neoplastic: Secondary | ICD-10-CM | POA: Diagnosis not present

## 2017-03-14 DIAGNOSIS — D485 Neoplasm of uncertain behavior of skin: Secondary | ICD-10-CM | POA: Diagnosis not present

## 2017-03-14 DIAGNOSIS — B078 Other viral warts: Secondary | ICD-10-CM | POA: Diagnosis not present

## 2017-03-14 DIAGNOSIS — D2261 Melanocytic nevi of right upper limb, including shoulder: Secondary | ICD-10-CM | POA: Diagnosis not present

## 2017-08-21 DIAGNOSIS — X32XXXD Exposure to sunlight, subsequent encounter: Secondary | ICD-10-CM | POA: Diagnosis not present

## 2017-08-21 DIAGNOSIS — L820 Inflamed seborrheic keratosis: Secondary | ICD-10-CM | POA: Diagnosis not present

## 2017-08-21 DIAGNOSIS — D225 Melanocytic nevi of trunk: Secondary | ICD-10-CM | POA: Diagnosis not present

## 2017-08-21 DIAGNOSIS — L570 Actinic keratosis: Secondary | ICD-10-CM | POA: Diagnosis not present

## 2017-09-05 DIAGNOSIS — J0100 Acute maxillary sinusitis, unspecified: Secondary | ICD-10-CM | POA: Diagnosis not present

## 2017-11-05 ENCOUNTER — Encounter: Payer: Self-pay | Admitting: Neurology

## 2017-11-05 ENCOUNTER — Other Ambulatory Visit: Payer: Self-pay

## 2017-11-05 ENCOUNTER — Telehealth: Payer: Self-pay | Admitting: Neurology

## 2017-11-05 ENCOUNTER — Ambulatory Visit (INDEPENDENT_AMBULATORY_CARE_PROVIDER_SITE_OTHER): Payer: Medicare Other | Admitting: Neurology

## 2017-11-05 VITALS — BP 150/82 | HR 62 | Resp 20

## 2017-11-05 DIAGNOSIS — R413 Other amnesia: Secondary | ICD-10-CM

## 2017-11-05 DIAGNOSIS — E538 Deficiency of other specified B group vitamins: Secondary | ICD-10-CM

## 2017-11-05 HISTORY — DX: Other amnesia: R41.3

## 2017-11-05 NOTE — Telephone Encounter (Signed)
Medicare order sent to GI. No auth they will reach out to the pt to schedule.  °

## 2017-11-05 NOTE — Progress Notes (Signed)
Reason for visit: Memory disturbance  Referring physician: Dr. Vinetta Bergamo is a 57 y.o. male  History of present illness:  Mr. Schrecengost is a 57 year old left-handed white male with a history of spina bifida, he has a VP shunt that was placed as a child or infant, he has not had a revision as a teenager or adult.  The patient is wheelchair-bound, he has never ambulated.  He has no sensation from the upper thigh level down, he has no motor function of the legs, he has a neurogenic bladder and bowel issue.  The patient has a very strong family history of memory disorders, his mother died with Alzheimer disease, his father lives with him currently and has developed a significant dementia as well.  Three maternal aunts have dementia.  The patient indicates that recently his father has had significant issues with confusion and memory, he is getting up frequently at night wondering about the house.  The patient has not been able to sleep well at night, he is only getting 3 or 4 hours of sleep each night, the problem with insomnia has developed over the last 2 months which correlate with the onset of his memory problem.  The patient is a bit more forgetful, he is having some trouble keeping up with his own medications but he is also responsible for managing his father's medications.  The patient is able to manage his own finances, he operates a motor vehicle without difficulty.  The patient is able to take notes and keep up with appointments.  The patient does not nap during the day, he is under a lot of stress associated with the dementia with his father.  The patient reports some slight lightheaded sensations, no true vertigo, he denies any new numbness or new weakness of the extremities.  He denies headaches.  The patient does have some left elbow pain after he had a fall and bumped his elbow on a doorknob.  He is sent to this office for an evaluation.  Past Medical History:  Diagnosis Date  .  Arterial occlusive disease   . Cold feet Nov 2013   Bilateral cold feet , discolored.  . Cutaneous skin tags   . Cyanosis   . Esophageal reflux   . Gall bladder polyp   . History of paraplegia    Secondary to spina bifida   . Hypertension   . Memory difficulty 11/05/2017  . Neurogenic bladder, NOS   . Spina bifida without mention of hydrocephalus, unspecified region   . UTI (lower urinary tract infection)     Past Surgical History:  Procedure Laterality Date  . COLONOSCOPY  2001  . CSF SHUNT      Family History  Problem Relation Age of Onset  . Alzheimer's disease Mother   . Hyperlipidemia Mother   . Dementia Mother   . Cataracts Father   . Hypertension Father   . Kidney failure Father   . Atrial fibrillation Father   . Other Sister        varicose veins    Social history:  reports that he has never smoked. He does not have any smokeless tobacco history on file. He reports that he does not drink alcohol or use drugs.  Medications:  Prior to Admission medications   Medication Sig Start Date End Date Taking? Authorizing Provider  acetaminophen (TYLENOL) 500 MG tablet Take 500 mg by mouth every 6 (six) hours as needed.   Yes [provider]  acyclovir (ZOVIRAX) 400 MG tablet Take 400 mg by mouth 3 (three) times daily.   Yes [provider]  atenolol (TENORMIN) 25 MG tablet Take by mouth.   Yes [provider]  cetirizine (ZYRTEC) 10 MG tablet Take 10 mg by mouth daily.   Yes [provider]  levothyroxine (SYNTHROID, LEVOTHROID) 75 MCG tablet TK 1 T PO QD 09/03/17  Yes [provider]  losartan (COZAAR) 100 MG tablet Take by mouth.   Yes [provider]  omeprazole (PRILOSEC) 20 MG capsule Take by mouth.   Yes [provider]  OVER THE COUNTER MEDICATION Rexall saline nasal spray   Yes [provider]  OVER THE COUNTER MEDICATION Micro-Guard Antifungal Powder with Miconazole Nitrate 2%   Yes [provider]  triamterene-hydrochlorothiazide (DYAZIDE) 37.5-25 MG capsule Take 1 capsule by mouth daily.   Yes [provider]      Allergies  Allergen Reactions  . Amlodipine Swelling  . Amoxicillin Rash  . Doxycycline Rash  . Sulfa Antibiotics Rash  . Famotidine Itching  . Folic Acid Itching    ROS:  Out of a complete 14 system review of symptoms, the patient complains only of the following symptoms, and all other reviewed systems are negative.  Weight gain, fatigue Hearing loss Urination problems Memory loss, confusion Not enough sleep  Blood pressure (!) 150/82, pulse 62, resp. rate 20.  Physical Exam  General: The patient is alert and cooperative at the time of the examination.  The patient is moderately obese.  Eyes: Pupils are equal, round, and reactive to light. Discs are flat bilaterally.  Neck: The neck is supple, no carotid bruits are noted.  Respiratory: The respiratory examination is clear.  Cardiovascular: The cardiovascular examination reveals a regular rate and rhythm, no obvious murmurs or rubs are noted.  Skin: Extremities are without significant edema.  The lower extremities are atrophied.  Neurologic Exam  Mental status: The patient is alert and oriented x 3 at the time of the examination. The patient has apparent normal recent and remote memory, with an apparently normal attention span and concentration ability.  Mini-Mental status examination done today shows a total score of 29/30.  The patient is able to name 7 four-legged animals in 1 minute.  Cranial nerves: Facial symmetry is present. There is good sensation of the face to pinprick and soft touch bilaterally. The strength of the facial muscles and the muscles to head turning and shoulder shrug are normal bilaterally. Speech is well enunciated, no aphasia or dysarthria is noted. Extraocular movements are notable for exotropia of the right eye with superior gaze, on primary gaze, there  is exotropia of the left eye, incomplete medial deviation of the left eye with rightward gaze. Visual fields are full. The tongue is midline, and the patient has symmetric elevation of the soft palate. No obvious hearing deficits are noted.  Motor: The motor testing reveals 5 over 5 strength of the upper extremities.  The patient has no motor control of the legs.  Good symmetric motor tone is noted throughout.  Sensory: Sensory testing is intact to pinprick, soft touch, vibration sensation, and position sense of the upper extremities.  The patient has no sensation to pinprick, soft touch, vibration or position sense in the legs.  No evidence of extinction is noted.  Coordination: Cerebellar testing reveals good finger-nose-finger bilaterally.  The patient is not able to perform heel-to-shin on either side.  Gait and station: Gait could not  be tested, the patient is wheelchair-bound.  Reflexes: Deep tendon reflexes are symmetric and normal in the upper extremities bilaterally.  Deep tendon reflexes are absent in the legs bilaterally.   Assessment/Plan:  1.  History of spina bifida, paraplegia  2.  Neurogenic bowel and bladder  3.  Reported memory disturbance  The patient has a very prominent history of dementia in the family.  The patient correlates his memory issue with onset of lack of sleep associated with the dementia of his father.  His father is quite confused, up and down all night long, the patient cannot rest well and he reports ongoing fatigue, inability to focus or concentrate during the day, his memory has deteriorated.  It is quite possible that the insomnia issue is the underlying etiology for the memory problem.  The patient will be sent for blood work today, he will have a CT scan of the brain.  He will follow-up in 6 months, we will follow the memory issue over time.  Jill Alexanders MD 11/05/2017 8:40 AM  [facility] [address] [place], [place] [address]  Phone [phone] Fax [phone]

## 2017-11-05 NOTE — Telephone Encounter (Signed)
lvm for pt to be aware of this. If he hasnt heard in the next 2-3 business days to give them a call at [phone].

## 2017-11-06 ENCOUNTER — Telehealth: Payer: Self-pay | Admitting: *Deleted

## 2017-11-06 LAB — SEDIMENTATION RATE: Sed Rate: 13 mm/hr (ref 0–30)

## 2017-11-06 LAB — RPR: RPR Ser Ql: NONREACTIVE

## 2017-11-06 LAB — VITAMIN B12: Vitamin B-12: 237 pg/mL (ref 232–1245)

## 2017-11-06 NOTE — Telephone Encounter (Signed)
LMOM (identified vm) with below lab results and rec. that he take otc vit. b12 1,026mcg once daily.  He does not need to return this call unless he has questions/fim

## 2017-11-06 NOTE — Telephone Encounter (Signed)
Pt has called in response to RN Faith's message.  He is asking for a call back to discuss suggestion of vitamin b12

## 2017-11-06 NOTE — Telephone Encounter (Signed)
Spoke with Kenneth Arroyo and answered questions regarding otc vit. b12 supp/fim

## 2017-11-06 NOTE — Telephone Encounter (Signed)
-----   Message from Kathrynn Ducking, MD sent at 11/06/2017 11:29 AM EDT ----- Blood work is unremarkable, vitamin B12 level is low but is in the low normal range, recommend going on vitamin B12 supplementation, 1000 mcg tablets daily.  Please call the patient ----- Message ----- From: Interface, [facility] Lab Results In Sent: 11/06/2017   7:39 AM EDT To: Kathrynn Ducking, MD

## 2017-11-11 ENCOUNTER — Telehealth: Payer: Self-pay | Admitting: Neurology

## 2017-11-11 DIAGNOSIS — R413 Other amnesia: Secondary | ICD-10-CM

## 2017-11-11 NOTE — Telephone Encounter (Signed)
When you get a chance can you put a new Ct order in for Kenneth Arroyo's [place] right now it is marked GI.

## 2017-11-11 NOTE — Telephone Encounter (Signed)
Yes, please schedule at [facility]. Thank you!

## 2017-11-11 NOTE — Telephone Encounter (Signed)
Patient states he got a call from [place] regarding scheduling a CT. He was told since he is in a wheelchair he would need to come there by ambulance so he would have assistance getting on the table. Patient says he will not pay for an ambulance and wants to know if there are any other options.

## 2017-11-11 NOTE — Addendum Note (Signed)
Addended by: France Ravens I on: 11/11/2017 02:21 PM   Modules accepted: Orders

## 2017-11-11 NOTE — Telephone Encounter (Signed)
Noted/fim 

## 2017-11-11 NOTE — Telephone Encounter (Signed)
The new CT order was signed.

## 2017-11-17 DIAGNOSIS — E039 Hypothyroidism, unspecified: Secondary | ICD-10-CM | POA: Diagnosis not present

## 2017-11-17 DIAGNOSIS — E7800 Pure hypercholesterolemia, unspecified: Secondary | ICD-10-CM | POA: Diagnosis not present

## 2017-11-17 DIAGNOSIS — I10 Essential (primary) hypertension: Secondary | ICD-10-CM | POA: Diagnosis not present

## 2017-11-17 DIAGNOSIS — N390 Urinary tract infection, site not specified: Secondary | ICD-10-CM | POA: Diagnosis not present

## 2017-11-17 DIAGNOSIS — Z125 Encounter for screening for malignant neoplasm of prostate: Secondary | ICD-10-CM | POA: Diagnosis not present

## 2017-11-19 ENCOUNTER — Ambulatory Visit (HOSPITAL_COMMUNITY)
Admission: RE | Admit: 2017-11-19 | Discharge: 2017-11-19 | Disposition: A | Discharge status: Home / Self Care | Payer: Medicare Other | Source: Ambulatory Visit | Attending: Neurology | Admitting: Neurology

## 2017-11-19 ENCOUNTER — Telehealth: Payer: Self-pay | Admitting: Neurology

## 2017-11-19 DIAGNOSIS — G9389 Other specified disorders of brain: Secondary | ICD-10-CM | POA: Insufficient documentation

## 2017-11-19 DIAGNOSIS — X58XXXA Exposure to other specified factors, initial encounter: Secondary | ICD-10-CM | POA: Insufficient documentation

## 2017-11-19 DIAGNOSIS — Q0700 Arnold-Chiari syndrome without spina bifida or hydrocephalus: Secondary | ICD-10-CM | POA: Insufficient documentation

## 2017-11-19 DIAGNOSIS — R413 Other amnesia: Secondary | ICD-10-CM | POA: Diagnosis not present

## 2017-11-19 DIAGNOSIS — T82330A Leakage of aortic (bifurcation) graft (replacement), initial encounter: Secondary | ICD-10-CM | POA: Insufficient documentation

## 2017-11-19 MED ORDER — HYDROCODONE-ACETAMINOPHEN 5-325 MG PO TABS: 1.0000 | ORAL_TABLET | ORAL | Status: DC | PRN

## 2017-11-19 NOTE — Procedures (Deleted)
  Procedure: Bilat pelvic arteriogram no embolization EBL:   minimal Complications:  none immediate  See full dictation in [facility].  Dillard Cannon MD Main # [phone] Pager  [phone]

## 2017-11-19 NOTE — Telephone Encounter (Signed)
I called the patient.  The CT shows biventricular shunt catheters, these are likely nonfunctioning.  The patient does have dilation of the lateral ventricles.  There are no comparison studies.  No other cortical issues are noted.  The patient will follow up in several months, we will follow the memory issues over time.   CT head 11/19/17:  IMPRESSION: 1. Chiari 2 malformation. 2. Ventriculomegaly with biparietal shunt catheters. The degree of ventricular dilatation is congruent with a degree of white matter volume loss. 3. Discontinuous right-sided shunt catheter. 4. No suspected reversible finding.

## 2017-11-20 DIAGNOSIS — I739 Peripheral vascular disease, unspecified: Secondary | ICD-10-CM | POA: Diagnosis not present

## 2017-11-20 DIAGNOSIS — N319 Neuromuscular dysfunction of bladder, unspecified: Secondary | ICD-10-CM | POA: Diagnosis not present

## 2017-11-20 DIAGNOSIS — N390 Urinary tract infection, site not specified: Secondary | ICD-10-CM | POA: Diagnosis not present

## 2017-11-20 DIAGNOSIS — E039 Hypothyroidism, unspecified: Secondary | ICD-10-CM | POA: Diagnosis not present

## 2017-11-20 DIAGNOSIS — I10 Essential (primary) hypertension: Secondary | ICD-10-CM | POA: Diagnosis not present

## 2017-11-20 DIAGNOSIS — H5010 Unspecified exotropia: Secondary | ICD-10-CM | POA: Diagnosis not present

## 2017-11-20 DIAGNOSIS — L304 Erythema intertrigo: Secondary | ICD-10-CM | POA: Diagnosis not present

## 2017-11-20 DIAGNOSIS — N281 Cyst of kidney, acquired: Secondary | ICD-10-CM | POA: Diagnosis not present

## 2017-11-20 DIAGNOSIS — E7800 Pure hypercholesterolemia, unspecified: Secondary | ICD-10-CM | POA: Diagnosis not present

## 2017-12-19 DIAGNOSIS — I10 Essential (primary) hypertension: Secondary | ICD-10-CM | POA: Diagnosis not present

## 2018-01-30 DIAGNOSIS — Z9109 Other allergy status, other than to drugs and biological substances: Secondary | ICD-10-CM | POA: Diagnosis not present

## 2018-01-30 DIAGNOSIS — M19022 Primary osteoarthritis, left elbow: Secondary | ICD-10-CM | POA: Diagnosis not present

## 2018-01-30 DIAGNOSIS — I10 Essential (primary) hypertension: Secondary | ICD-10-CM | POA: Diagnosis not present

## 2018-03-27 DIAGNOSIS — Z9109 Other allergy status, other than to drugs and biological substances: Secondary | ICD-10-CM | POA: Diagnosis not present

## 2018-03-27 DIAGNOSIS — I10 Essential (primary) hypertension: Secondary | ICD-10-CM | POA: Diagnosis not present

## 2018-03-27 DIAGNOSIS — M19022 Primary osteoarthritis, left elbow: Secondary | ICD-10-CM | POA: Diagnosis not present

## 2018-05-20 DIAGNOSIS — R5383 Other fatigue: Secondary | ICD-10-CM | POA: Diagnosis not present

## 2018-05-20 DIAGNOSIS — I10 Essential (primary) hypertension: Secondary | ICD-10-CM | POA: Diagnosis not present

## 2018-05-20 DIAGNOSIS — E039 Hypothyroidism, unspecified: Secondary | ICD-10-CM | POA: Diagnosis not present

## 2018-05-20 DIAGNOSIS — Z9359 Other cystostomy status: Secondary | ICD-10-CM | POA: Diagnosis not present

## 2018-05-20 DIAGNOSIS — E7800 Pure hypercholesterolemia, unspecified: Secondary | ICD-10-CM | POA: Diagnosis not present

## 2018-05-20 DIAGNOSIS — Q059 Spina bifida, unspecified: Secondary | ICD-10-CM | POA: Diagnosis not present

## 2018-05-20 DIAGNOSIS — N390 Urinary tract infection, site not specified: Secondary | ICD-10-CM | POA: Diagnosis not present

## 2018-05-20 DIAGNOSIS — N319 Neuromuscular dysfunction of bladder, unspecified: Secondary | ICD-10-CM | POA: Diagnosis not present

## 2018-05-27 DIAGNOSIS — N390 Urinary tract infection, site not specified: Secondary | ICD-10-CM | POA: Diagnosis not present

## 2018-05-27 DIAGNOSIS — K219 Gastro-esophageal reflux disease without esophagitis: Secondary | ICD-10-CM | POA: Diagnosis not present

## 2018-05-27 DIAGNOSIS — I739 Peripheral vascular disease, unspecified: Secondary | ICD-10-CM | POA: Diagnosis not present

## 2018-05-27 DIAGNOSIS — Q059 Spina bifida, unspecified: Secondary | ICD-10-CM | POA: Diagnosis not present

## 2018-05-27 DIAGNOSIS — Z9359 Other cystostomy status: Secondary | ICD-10-CM | POA: Diagnosis not present

## 2018-05-27 DIAGNOSIS — E039 Hypothyroidism, unspecified: Secondary | ICD-10-CM | POA: Diagnosis not present

## 2018-05-27 DIAGNOSIS — B0089 Other herpesviral infection: Secondary | ICD-10-CM | POA: Diagnosis not present

## 2018-05-27 DIAGNOSIS — N319 Neuromuscular dysfunction of bladder, unspecified: Secondary | ICD-10-CM | POA: Diagnosis not present

## 2018-05-27 DIAGNOSIS — E7800 Pure hypercholesterolemia, unspecified: Secondary | ICD-10-CM | POA: Diagnosis not present

## 2018-05-27 DIAGNOSIS — Z9109 Other allergy status, other than to drugs and biological substances: Secondary | ICD-10-CM | POA: Diagnosis not present

## 2018-05-27 DIAGNOSIS — I10 Essential (primary) hypertension: Secondary | ICD-10-CM | POA: Diagnosis not present

## 2018-05-27 DIAGNOSIS — H5010 Unspecified exotropia: Secondary | ICD-10-CM | POA: Diagnosis not present

## 2018-05-28 ENCOUNTER — Telehealth: Payer: Self-pay | Admitting: *Deleted

## 2018-05-28 NOTE — Telephone Encounter (Signed)
LMVM for pt, that MM/NP out of office in am on 07-07-18. rescheduled with sarah slack, np for Dr. Jannifer Franklin for same day 07-07-18 at [place].  (original appt 0830 on 07-07-18 with MM/NP).  Just an FYI.

## 2018-06-25 DIAGNOSIS — I10 Essential (primary) hypertension: Secondary | ICD-10-CM | POA: Diagnosis not present

## 2018-06-25 DIAGNOSIS — R609 Edema, unspecified: Secondary | ICD-10-CM | POA: Diagnosis not present

## 2018-06-25 DIAGNOSIS — R0981 Nasal congestion: Secondary | ICD-10-CM | POA: Diagnosis not present

## 2018-06-25 DIAGNOSIS — N390 Urinary tract infection, site not specified: Secondary | ICD-10-CM | POA: Diagnosis not present

## 2018-06-25 DIAGNOSIS — R6883 Chills (without fever): Secondary | ICD-10-CM | POA: Diagnosis not present

## 2018-06-25 DIAGNOSIS — N319 Neuromuscular dysfunction of bladder, unspecified: Secondary | ICD-10-CM | POA: Diagnosis not present

## 2018-06-29 ENCOUNTER — Other Ambulatory Visit: Payer: Self-pay | Admitting: Urology

## 2018-06-29 ENCOUNTER — Other Ambulatory Visit (HOSPITAL_COMMUNITY): Payer: Self-pay | Admitting: Urology

## 2018-06-29 DIAGNOSIS — R600 Localized edema: Secondary | ICD-10-CM | POA: Diagnosis not present

## 2018-06-29 DIAGNOSIS — Z792 Long term (current) use of antibiotics: Secondary | ICD-10-CM | POA: Diagnosis not present

## 2018-06-29 DIAGNOSIS — Z982 Presence of cerebrospinal fluid drainage device: Secondary | ICD-10-CM | POA: Diagnosis not present

## 2018-06-29 DIAGNOSIS — Z9181 History of falling: Secondary | ICD-10-CM | POA: Diagnosis not present

## 2018-06-29 DIAGNOSIS — Z993 Dependence on wheelchair: Secondary | ICD-10-CM | POA: Diagnosis not present

## 2018-06-29 DIAGNOSIS — R310 Gross hematuria: Secondary | ICD-10-CM

## 2018-06-29 DIAGNOSIS — I739 Peripheral vascular disease, unspecified: Secondary | ICD-10-CM | POA: Diagnosis not present

## 2018-06-29 DIAGNOSIS — Q059 Spina bifida, unspecified: Secondary | ICD-10-CM | POA: Diagnosis not present

## 2018-06-29 DIAGNOSIS — K219 Gastro-esophageal reflux disease without esophagitis: Secondary | ICD-10-CM | POA: Diagnosis not present

## 2018-06-29 DIAGNOSIS — N318 Other neuromuscular dysfunction of bladder: Secondary | ICD-10-CM | POA: Diagnosis not present

## 2018-06-29 DIAGNOSIS — N319 Neuromuscular dysfunction of bladder, unspecified: Secondary | ICD-10-CM | POA: Diagnosis not present

## 2018-06-29 DIAGNOSIS — I10 Essential (primary) hypertension: Secondary | ICD-10-CM | POA: Diagnosis not present

## 2018-06-29 DIAGNOSIS — E039 Hypothyroidism, unspecified: Secondary | ICD-10-CM | POA: Diagnosis not present

## 2018-06-29 DIAGNOSIS — N3020 Other chronic cystitis without hematuria: Secondary | ICD-10-CM | POA: Diagnosis not present

## 2018-06-29 DIAGNOSIS — E7800 Pure hypercholesterolemia, unspecified: Secondary | ICD-10-CM | POA: Diagnosis not present

## 2018-06-30 DIAGNOSIS — N318 Other neuromuscular dysfunction of bladder: Secondary | ICD-10-CM | POA: Diagnosis not present

## 2018-06-30 DIAGNOSIS — N3020 Other chronic cystitis without hematuria: Secondary | ICD-10-CM | POA: Diagnosis not present

## 2018-06-30 DIAGNOSIS — E039 Hypothyroidism, unspecified: Secondary | ICD-10-CM | POA: Diagnosis not present

## 2018-06-30 DIAGNOSIS — R600 Localized edema: Secondary | ICD-10-CM | POA: Diagnosis not present

## 2018-06-30 DIAGNOSIS — Q059 Spina bifida, unspecified: Secondary | ICD-10-CM | POA: Diagnosis not present

## 2018-06-30 DIAGNOSIS — I10 Essential (primary) hypertension: Secondary | ICD-10-CM | POA: Diagnosis not present

## 2018-07-03 ENCOUNTER — Other Ambulatory Visit: Payer: Self-pay

## 2018-07-03 ENCOUNTER — Ambulatory Visit (HOSPITAL_COMMUNITY)
Admission: RE | Admit: 2018-07-03 | Discharge: 2018-07-03 | Disposition: A | Discharge status: Home / Self Care | Payer: Medicare Other | Source: Ambulatory Visit | Attending: Urology | Admitting: Urology

## 2018-07-03 DIAGNOSIS — N400 Enlarged prostate without lower urinary tract symptoms: Secondary | ICD-10-CM | POA: Diagnosis not present

## 2018-07-03 DIAGNOSIS — R310 Gross hematuria: Secondary | ICD-10-CM | POA: Insufficient documentation

## 2018-07-03 DIAGNOSIS — N3289 Other specified disorders of bladder: Secondary | ICD-10-CM | POA: Diagnosis not present

## 2018-07-03 DIAGNOSIS — K4090 Unilateral inguinal hernia, without obstruction or gangrene, not specified as recurrent: Secondary | ICD-10-CM | POA: Diagnosis not present

## 2018-07-03 MED ORDER — IOHEXOL 300 MG/ML  SOLN
100.0000 mL | Freq: Once | INTRAMUSCULAR | Status: AC | PRN
  Administered 2018-07-03: 100 mL via INTRAVENOUS

## 2018-07-06 DIAGNOSIS — E039 Hypothyroidism, unspecified: Secondary | ICD-10-CM | POA: Diagnosis not present

## 2018-07-06 DIAGNOSIS — N318 Other neuromuscular dysfunction of bladder: Secondary | ICD-10-CM | POA: Diagnosis not present

## 2018-07-06 DIAGNOSIS — N3020 Other chronic cystitis without hematuria: Secondary | ICD-10-CM | POA: Diagnosis not present

## 2018-07-06 DIAGNOSIS — I10 Essential (primary) hypertension: Secondary | ICD-10-CM | POA: Diagnosis not present

## 2018-07-06 DIAGNOSIS — Q059 Spina bifida, unspecified: Secondary | ICD-10-CM | POA: Diagnosis not present

## 2018-07-06 DIAGNOSIS — R600 Localized edema: Secondary | ICD-10-CM | POA: Diagnosis not present

## 2018-07-07 ENCOUNTER — Encounter: Payer: Self-pay | Admitting: Neurology

## 2018-07-07 ENCOUNTER — Other Ambulatory Visit: Payer: Self-pay

## 2018-07-07 ENCOUNTER — Ambulatory Visit (INDEPENDENT_AMBULATORY_CARE_PROVIDER_SITE_OTHER): Payer: Medicare Other | Admitting: Neurology

## 2018-07-07 ENCOUNTER — Ambulatory Visit: Payer: Self-pay | Admitting: Adult Health

## 2018-07-07 VITALS — BP 124/78 | HR 52 | Temp 97.7°F

## 2018-07-07 DIAGNOSIS — R413 Other amnesia: Secondary | ICD-10-CM

## 2018-07-07 NOTE — Progress Notes (Signed)
I have read the note, and I agree with the clinical assessment and plan.  Kenneth Arroyo   

## 2018-07-07 NOTE — Progress Notes (Signed)
PATIENT: Kenneth Arroyo DOB: May 27, 1960  REASON FOR VISIT: follow up HISTORY FROM: patient  HISTORY OF PRESENT ILLNESS: Today 07/07/18  Kenneth Arroyo is a 58 year old male with history of spina bifida with a VP shunt.  At last visit, his B12 level was found to be low normal range, was started on oral supplementation.  He had a CT scan of the brain on November 2019 showing biventricular shunt catheters, with dilation of the lateral ventricles.  His MMSE in October 2019 was 29/30.  He is wheelchair-bound, he has never ambulated.  He has a neurogenic bowel and bladder.  He has a very strong family history of memory disorder. He is able to operate a motor vehicle without difficulty.  He has been the primary caregiver for his father who suffered from advanced dementia.  His father recently passed away.  He continues to report insomnia, he will wake up at 2 or 3 in the morning, is not able to go back to sleep, will toss and turn.  He reports continued difficulty with his memory.  He will find that he is forgetful.  For this appointment, he came on Friday, had forgotten the office was closed.  He is able to manage his medications and finances.  He is currently working with social work to try to move to more affordable housing.  He is currently being evaluated by urology for 2 kinds of bacteria that was found in his urine.  He is planning to have cystoscopy to evaluate hematuria in the next few weeks.  He does have neurogenic bladder, he will self cath every 4-6 hours.  He reports he only has sensation when urine is leaking.  He is doing home physical therapy for right shoulder pain.  He says his primary care doctor recently tried to start medication for depression.  After talking with the pharmacy, he decided not to take the medication.  HISTORY 11/05/2017 Dr. Jannifer Franklin: Kenneth Arroyo is a 58 year old left-handed white male with a history of spina bifida, he has a VP shunt that was placed as a child or infant, he has not  had a revision as a teenager or adult.  The patient is wheelchair-bound, he has never ambulated.  He has no sensation from the upper thigh level down, he has no motor function of the legs, he has a neurogenic bladder and bowel issue.  The patient has a very strong family history of memory disorders, his mother died with Alzheimer disease, his father lives with him currently and has developed a significant dementia as well.  Three maternal aunts have dementia.  The patient indicates that recently his father has had significant issues with confusion and memory, he is getting up frequently at night wondering about the house.  The patient has not been able to sleep well at night, he is only getting 3 or 4 hours of sleep each night, the problem with insomnia has developed over the last 2 months which correlate with the onset of his memory problem.  The patient is a bit more forgetful, he is having some trouble keeping up with his own medications but he is also responsible for managing his father's medications.  The patient is able to manage his own finances, he operates a motor vehicle without difficulty.  The patient is able to take notes and keep up with appointments.  The patient does not nap during the day, he is under a lot of stress associated with the dementia with his father.  The patient reports some slight lightheaded sensations, no true vertigo, he denies any new numbness or new weakness of the extremities.  He denies headaches.  The patient does have some left elbow pain after he had a fall and bumped his elbow on a doorknob.  He is sent to this office for an evaluation.  REVIEW OF SYSTEMS: Out of a complete 14 system review of symptoms, the patient complains only of the following symptoms, and all other reviewed systems are negative.  Memory loss, dizziness, weakness  ALLERGIES: Allergies  Allergen Reactions   Amlodipine Swelling   Amoxicillin Rash   Doxycycline Rash   Sulfa Antibiotics Rash     Famotidine Itching   Folic Acid Itching    HOME MEDICATIONS: Outpatient Medications Prior to Visit  Medication Sig Dispense Refill   acetaminophen (TYLENOL) 500 MG tablet Take 500 mg by mouth every 6 (six) hours as needed.     acyclovir (ZOVIRAX) 400 MG tablet Take 400 mg by mouth 3 (three) times daily.     atenolol (TENORMIN) 25 MG tablet Take by mouth.     cetirizine (ZYRTEC) 10 MG tablet Take 10 mg by mouth daily.     levothyroxine (SYNTHROID, LEVOTHROID) 75 MCG tablet TK 1 T PO QD  5   losartan (COZAAR) 100 MG tablet Take by mouth.     omeprazole (PRILOSEC) 20 MG capsule Take by mouth.     OVER THE COUNTER MEDICATION Rexall saline nasal spray     OVER THE COUNTER MEDICATION Micro-Guard Antifungal Powder with Miconazole Nitrate 2%     triamterene-hydrochlorothiazide (DYAZIDE) 37.5-25 MG capsule Take 1 capsule by mouth daily.     No facility-administered medications prior to visit.     PAST MEDICAL HISTORY: Past Medical History:  Diagnosis Date   Arterial occlusive disease    Cold feet Nov 2013   Bilateral cold feet , discolored.   Cutaneous skin tags    Cyanosis    Esophageal reflux    Gall bladder polyp    History of paraplegia    Secondary to spina bifida    Hypertension    Memory difficulty 11/05/2017   Neurogenic bladder, NOS    Spina bifida without mention of hydrocephalus, unspecified region    UTI (lower urinary tract infection)     PAST SURGICAL HISTORY: Past Surgical History:  Procedure Laterality Date   COLONOSCOPY  2001   CSF SHUNT      FAMILY HISTORY: Family History  Problem Relation Age of Onset   Alzheimer's disease Mother    Hyperlipidemia Mother    Dementia Mother    Cataracts Father    Hypertension Father    Kidney failure Father    Atrial fibrillation Father    Other Sister        varicose veins    SOCIAL HISTORY: Social History   Socioeconomic History   Marital status: Single    Spouse name:  Not on file   Number of children: Not on file   Years of education: Not on file   Highest education level: Not on file  Occupational History   Not on file  Social Needs   Financial resource strain: Not on file   Food insecurity    Worry: Not on file    Inability: Not on file   Transportation needs    Medical: Not on file    Non-medical: Not on file  Tobacco Use   Smoking status: Never Smoker  Substance and Sexual Activity  Alcohol use: No   Drug use: No   Sexual activity: Not on file  Lifestyle   Physical activity    Days per week: Not on file    Minutes per session: Not on file   Stress: Not on file  Relationships   Social connections    Talks on phone: Not on file    Gets together: Not on file    Attends religious service: Not on file    Active member of club or organization: Not on file    Attends meetings of clubs or organizations: Not on file    Relationship status: Not on file   Intimate partner violence    Fear of current or ex partner: Not on file    Emotionally abused: Not on file    Physically abused: Not on file    Forced sexual activity: Not on file  Other Topics Concern   Not on file  Social History Narrative   Not on file      PHYSICAL EXAM  There were no vitals filed for this visit. There is no height or weight on file to calculate BMI.  Generalized: Well developed, in no acute distress  MMSE - Mini Mental State Exam 07/07/2018 11/05/2017  Orientation to time 5 5  Orientation to Place 5 5  Registration 3 3  Attention/ Calculation 5 5  Recall 1 2  Language- name 2 objects 2 2  Language- repeat 1 1  Language- follow 3 step command 3 3  Language- read & follow direction 1 1  Write a sentence 1 1  Copy design 1 1  Total score 28 29    Neurological examination  Mentation: Alert oriented to time, place, history taking. Follows all commands speech and language fluent Cranial nerve II-XII:  Extraocular movements were full,  visual field were full on confrontational test. Facial sensation and strength were normal. Uvula tongue midline. Head turning and shoulder shrug  were normal and symmetric. Motor: The motor testing reveals 4 over 5 strength of upper extremities,. Good symmetric motor tone is noted throughout.  Sensory: Sensory testing is intact to soft touch on his upper extremities, he has no sensation to the legs, He has no motor control of his lower extremities. No evidence of extinction is noted.  Coordination: Cerebellar testing reveals good finger-nose-finger.  Unable to perform heel-to-shin Gait and station: He is wheelchair-bound, does not ambulate.  Reflexes: Deep tendon reflexes are symmetric and normal in the upper extremities, absent reflexes in the legs  DIAGNOSTIC DATA (LABS, IMAGING, TESTING) - I reviewed patient records, labs, notes, testing and imaging myself where available.  No results found for: WBC, HGB, HCT, MCV, PLT No results found for: NA, K, CL, CO2, GLUCOSE, BUN, CREATININE, CALCIUM, PROT, ALBUMIN, AST, ALT, ALKPHOS, BILITOT, GFRNONAA, GFRAA No results found for: CHOL, HDL, LDLCALC, LDLDIRECT, TRIG, CHOLHDL No results found for: HGBA1C Lab Results  Component Value Date   VITAMINB12 237 11/05/2017   No results found for: TSH  CT head 11/19/17:  IMPRESSION: 1. Chiari 2 malformation. 2. Ventriculomegaly with biparietal shunt catheters. The degree of ventricular dilatation is congruent with a degree of white matter volume loss. 3. Discontinuous right-sided shunt catheter. 4. No suspected reversible finding.  ASSESSMENT AND PLAN 58 y.o. year old male  has a past medical history of Arterial occlusive disease, Cold feet (Nov 2013), Cutaneous skin tags, Cyanosis, Esophageal reflux, Gall bladder polyp, History of paraplegia, Hypertension, Memory difficulty (11/05/2017), Neurogenic bladder, NOS, Spina bifida without mention  of hydrocephalus, unspecified region, and UTI (lower urinary  tract infection). here with:  1.  History of spina bifida, paraplegic 2.  Neurogenic bowel and bladder 3.  Reported memory disturbance  His memory is score is stable.  Today it was 28/30.  Since last visit, his father has passed away. Kenneth. Counterman has been the primary caregiver for his father, often had trouble sleeping as result.  Since his father has passed, his insomnia issues have continued.  His trouble sleeping could be contributing to his reported memory difficulty.  I have suggested we could try a trial of trazodone at bedtime to see if this may help with sleep, in turn improve memory.  At this time he is currently undergoing evaluation with urology for hematuria, has a planned cystoscopy in the next few weeks.  He does not wish to make any changes at this time.  I have suggested he discuss with his primary care team, who he seems to have established good rapport.  In the future, we may consider the addition of Aricept. He does have neurogenic bladder, he does scheduled self catheterizations.  His B12 level was low, normal, he remains on oral supplementation.  He is currently working with a Education officer, museum to find more affordable housing, additional resources. He had a CT scan done in November 2019 showed biventricular shunt catheters, does have dilation of the lateral ventricles.  No other cortical issues noted. No suspected reversible finding for memory loss was seen.  I spent 15 minutes with the patient. 50% of this time was spent discussing his plan of care.    Butler Denmark, AGNP-C, DNP 07/07/2018, 7:44 AM [facility] [address], [place], [place] [address] [phone]

## 2018-07-07 NOTE — Patient Instructions (Signed)
It was very nice to meet you! Your memory score was 28/30 today, this is essentially the same at last visit. In the future, we could consider the addition of a medication such as, trazodone to see if that helps with sleep, which could in turn help your memory. Please discuss with your primary care doctor or call us if you change your mind.

## 2018-07-10 DIAGNOSIS — N3020 Other chronic cystitis without hematuria: Secondary | ICD-10-CM | POA: Diagnosis not present

## 2018-07-10 DIAGNOSIS — N318 Other neuromuscular dysfunction of bladder: Secondary | ICD-10-CM | POA: Diagnosis not present

## 2018-07-10 DIAGNOSIS — I10 Essential (primary) hypertension: Secondary | ICD-10-CM | POA: Diagnosis not present

## 2018-07-10 DIAGNOSIS — R600 Localized edema: Secondary | ICD-10-CM | POA: Diagnosis not present

## 2018-07-10 DIAGNOSIS — E039 Hypothyroidism, unspecified: Secondary | ICD-10-CM | POA: Diagnosis not present

## 2018-07-10 DIAGNOSIS — Q059 Spina bifida, unspecified: Secondary | ICD-10-CM | POA: Diagnosis not present

## 2018-07-13 DIAGNOSIS — Q059 Spina bifida, unspecified: Secondary | ICD-10-CM | POA: Diagnosis not present

## 2018-07-13 DIAGNOSIS — N3020 Other chronic cystitis without hematuria: Secondary | ICD-10-CM | POA: Diagnosis not present

## 2018-07-13 DIAGNOSIS — R600 Localized edema: Secondary | ICD-10-CM | POA: Diagnosis not present

## 2018-07-13 DIAGNOSIS — E039 Hypothyroidism, unspecified: Secondary | ICD-10-CM | POA: Diagnosis not present

## 2018-07-13 DIAGNOSIS — I10 Essential (primary) hypertension: Secondary | ICD-10-CM | POA: Diagnosis not present

## 2018-07-13 DIAGNOSIS — N318 Other neuromuscular dysfunction of bladder: Secondary | ICD-10-CM | POA: Diagnosis not present

## 2018-07-16 DIAGNOSIS — R600 Localized edema: Secondary | ICD-10-CM | POA: Diagnosis not present

## 2018-07-16 DIAGNOSIS — I10 Essential (primary) hypertension: Secondary | ICD-10-CM | POA: Diagnosis not present

## 2018-07-16 DIAGNOSIS — Q059 Spina bifida, unspecified: Secondary | ICD-10-CM | POA: Diagnosis not present

## 2018-07-16 DIAGNOSIS — E039 Hypothyroidism, unspecified: Secondary | ICD-10-CM | POA: Diagnosis not present

## 2018-07-16 DIAGNOSIS — N3020 Other chronic cystitis without hematuria: Secondary | ICD-10-CM | POA: Diagnosis not present

## 2018-07-16 DIAGNOSIS — N318 Other neuromuscular dysfunction of bladder: Secondary | ICD-10-CM | POA: Diagnosis not present

## 2018-07-20 DIAGNOSIS — N35012 Post-traumatic membranous urethral stricture: Secondary | ICD-10-CM | POA: Diagnosis not present

## 2018-07-20 DIAGNOSIS — D414 Neoplasm of uncertain behavior of bladder: Secondary | ICD-10-CM | POA: Diagnosis not present

## 2018-07-20 DIAGNOSIS — R310 Gross hematuria: Secondary | ICD-10-CM | POA: Diagnosis not present

## 2018-07-20 DIAGNOSIS — N3020 Other chronic cystitis without hematuria: Secondary | ICD-10-CM | POA: Diagnosis not present

## 2018-07-21 DIAGNOSIS — E039 Hypothyroidism, unspecified: Secondary | ICD-10-CM | POA: Diagnosis not present

## 2018-07-21 DIAGNOSIS — R600 Localized edema: Secondary | ICD-10-CM | POA: Diagnosis not present

## 2018-07-21 DIAGNOSIS — I10 Essential (primary) hypertension: Secondary | ICD-10-CM | POA: Diagnosis not present

## 2018-07-21 DIAGNOSIS — N3020 Other chronic cystitis without hematuria: Secondary | ICD-10-CM | POA: Diagnosis not present

## 2018-07-21 DIAGNOSIS — N318 Other neuromuscular dysfunction of bladder: Secondary | ICD-10-CM | POA: Diagnosis not present

## 2018-07-21 DIAGNOSIS — Q059 Spina bifida, unspecified: Secondary | ICD-10-CM | POA: Diagnosis not present

## 2018-07-22 ENCOUNTER — Inpatient Hospital Stay (HOSPITAL_COMMUNITY): Admission: AD | Admit: 2018-07-22 | Payer: Medicare Other | Source: Other Acute Inpatient Hospital

## 2018-07-24 DIAGNOSIS — N318 Other neuromuscular dysfunction of bladder: Secondary | ICD-10-CM | POA: Diagnosis not present

## 2018-07-24 DIAGNOSIS — Q059 Spina bifida, unspecified: Secondary | ICD-10-CM | POA: Diagnosis not present

## 2018-07-24 DIAGNOSIS — I10 Essential (primary) hypertension: Secondary | ICD-10-CM | POA: Diagnosis not present

## 2018-07-24 DIAGNOSIS — E039 Hypothyroidism, unspecified: Secondary | ICD-10-CM | POA: Diagnosis not present

## 2018-07-24 DIAGNOSIS — N3020 Other chronic cystitis without hematuria: Secondary | ICD-10-CM | POA: Diagnosis not present

## 2018-07-24 DIAGNOSIS — R600 Localized edema: Secondary | ICD-10-CM | POA: Diagnosis not present

## 2018-07-30 DIAGNOSIS — N318 Other neuromuscular dysfunction of bladder: Secondary | ICD-10-CM | POA: Diagnosis not present

## 2018-07-30 DIAGNOSIS — Q059 Spina bifida, unspecified: Secondary | ICD-10-CM | POA: Diagnosis not present

## 2018-07-30 DIAGNOSIS — N3020 Other chronic cystitis without hematuria: Secondary | ICD-10-CM | POA: Diagnosis not present

## 2018-07-30 DIAGNOSIS — R600 Localized edema: Secondary | ICD-10-CM | POA: Diagnosis not present

## 2018-08-10 DIAGNOSIS — R600 Localized edema: Secondary | ICD-10-CM | POA: Diagnosis not present

## 2018-08-10 DIAGNOSIS — I10 Essential (primary) hypertension: Secondary | ICD-10-CM | POA: Diagnosis not present

## 2018-08-10 DIAGNOSIS — Q059 Spina bifida, unspecified: Secondary | ICD-10-CM | POA: Diagnosis not present

## 2018-08-10 DIAGNOSIS — N3020 Other chronic cystitis without hematuria: Secondary | ICD-10-CM | POA: Diagnosis not present

## 2018-08-10 DIAGNOSIS — N318 Other neuromuscular dysfunction of bladder: Secondary | ICD-10-CM | POA: Diagnosis not present

## 2018-10-04 ENCOUNTER — Emergency Department (HOSPITAL_COMMUNITY): Payer: Medicare Other

## 2018-10-04 ENCOUNTER — Emergency Department (HOSPITAL_COMMUNITY)
Admission: EM | Admit: 2018-10-04 | Discharge: 2018-10-05 | Disposition: A | Discharge status: Home / Self Care | Payer: Medicare Other | Attending: Emergency Medicine | Admitting: Emergency Medicine

## 2018-10-04 ENCOUNTER — Encounter (HOSPITAL_COMMUNITY): Payer: Self-pay

## 2018-10-04 ENCOUNTER — Other Ambulatory Visit: Payer: Self-pay

## 2018-10-04 DIAGNOSIS — I10 Essential (primary) hypertension: Secondary | ICD-10-CM | POA: Insufficient documentation

## 2018-10-04 DIAGNOSIS — Z79899 Other long term (current) drug therapy: Secondary | ICD-10-CM | POA: Insufficient documentation

## 2018-10-04 DIAGNOSIS — Z20828 Contact with and (suspected) exposure to other viral communicable diseases: Secondary | ICD-10-CM | POA: Diagnosis not present

## 2018-10-04 DIAGNOSIS — R05 Cough: Secondary | ICD-10-CM | POA: Diagnosis not present

## 2018-10-04 DIAGNOSIS — R197 Diarrhea, unspecified: Secondary | ICD-10-CM | POA: Diagnosis not present

## 2018-10-04 DIAGNOSIS — N390 Urinary tract infection, site not specified: Secondary | ICD-10-CM

## 2018-10-04 DIAGNOSIS — R109 Unspecified abdominal pain: Secondary | ICD-10-CM | POA: Diagnosis present

## 2018-10-04 LAB — COMPREHENSIVE METABOLIC PANEL
ALT: 20 U/L (ref 0–44)
AST: 19 U/L (ref 15–41)
Albumin: 4 g/dL (ref 3.5–5.0)
Alkaline Phosphatase: 53 U/L (ref 38–126)
Anion gap: 9 (ref 5–15)
BUN: 24 mg/dL — ABNORMAL HIGH (ref 6–20)
CO2: 28 mmol/L (ref 22–32)
Calcium: 9.1 mg/dL (ref 8.9–10.3)
Chloride: 97 mmol/L — ABNORMAL LOW (ref 98–111)
Creatinine, Ser: 1.04 mg/dL (ref 0.61–1.24)
GFR calc Af Amer: >60 mL/min (ref >60)
GFR calc non Af Amer: >60 mL/min (ref >60)
Glucose, Bld: 116 mg/dL — ABNORMAL HIGH (ref 70–99)
Potassium: 3.1 mmol/L — ABNORMAL LOW (ref 3.5–5.1)
Sodium: 134 mmol/L — ABNORMAL LOW (ref 135–145)
Total Bilirubin: 1 mg/dL (ref 0.3–1.2)
Total Protein: 7.4 g/dL (ref 6.5–8.1)

## 2018-10-04 LAB — CBC
HCT: 40.2 % (ref 39.0–52.0)
Hemoglobin: 14 g/dL (ref 13.0–17.0)
MCH: 31 pg (ref 26.0–34.0)
MCHC: 34.8 g/dL (ref 30.0–36.0)
MCV: 89.1 fL (ref 80.0–100.0)
Platelets: 135 10*3/uL — ABNORMAL LOW (ref 150–400)
RBC: 4.51 MIL/uL (ref 4.22–5.81)
RDW: 13.2 % (ref 11.5–15.5)
WBC: 7.6 10*3/uL (ref 4.0–10.5)
nRBC: 0 % (ref 0.0–0.2)

## 2018-10-04 LAB — LIPASE, BLOOD: Lipase: 30 U/L (ref 11–51)

## 2018-10-04 MED ORDER — IOHEXOL 300 MG/ML  SOLN
100.0000 mL | Freq: Once | INTRAMUSCULAR | Status: AC | PRN
  Administered 2018-10-05: 01:00:00 100 mL via INTRAVENOUS

## 2018-10-04 MED ORDER — LACTATED RINGERS IV BOLUS
1000.0000 mL | Freq: Once | INTRAVENOUS | Status: AC
  Administered 2018-10-04: 1000 mL via INTRAVENOUS

## 2018-10-04 MED ORDER — POTASSIUM CHLORIDE CRYS ER 20 MEQ PO TBCR
40.0000 meq | EXTENDED_RELEASE_TABLET | Freq: Once | ORAL | Status: AC
  Administered 2018-10-04: 40 meq via ORAL
  Filled 2018-10-04: qty 2

## 2018-10-04 MED ORDER — SODIUM CHLORIDE (PF) 0.9 % IJ SOLN
INTRAMUSCULAR | Status: AC
  Administered 2018-10-05
  Filled 2018-10-04: qty 50

## 2018-10-04 NOTE — ED Triage Notes (Signed)
Pt having diarrhea and fatigue for couple days. Pt thinks has some blood in diarrhea and in urine when self cathed.

## 2018-10-04 NOTE — ED Provider Notes (Signed)
[facility] DEPT Provider Note   CSN: [id number] Arrival date & time: 10/04/18  1542     History   Chief Complaint Chief Complaint  Patient presents with  . Diarrhea  . Fatigue    HPI HERBIE FANSLER is a 58 y.o. male.     Patient is a pleasant 58 year old male with a history of spina bifida, neurogenic bladder and hypertension who is presenting today with 4 days of diarrhea that has been ongoing anytime he eats or drinks, also chest congestion and cough.  He denies any chest pain or shortness of breath but states he has had some abdominal pain over the last few days.  He currently self caths every 4-6 hours and has not noticed any change in his urine.  He does not check his temperature at home but on Wednesday did feel chilled.  He has not had any nausea or vomiting has not had any recent antibiotic use in the last 6 weeks and has no known bad food exposure.  The history is provided by the patient.  Diarrhea Quality:  Watery Severity:  Moderate Onset quality:  Gradual Number of episodes:  5 per day but sometimes more Duration:  4 days Timing:  Intermittent Progression:  Unchanged Relieved by:  None tried Exacerbated by: eating and drinking. Ineffective treatments:  None tried Associated symptoms: abdominal pain, chills, cough and URI   Associated symptoms: no diaphoresis, no myalgias and no vomiting   Associated symptoms comment:  Some blood streaks in stool today on the bed sheet. Risk factors: no recent antibiotic use and no sick contacts     Past Medical History:  Diagnosis Date  . Arterial occlusive disease   . Cold feet Nov 2013   Bilateral cold feet , discolored.  . Cutaneous skin tags   . Cyanosis   . Esophageal reflux   . Gall bladder polyp   . History of paraplegia    Secondary to spina bifida   . Hypertension   . Memory difficulty 11/05/2017  . Neurogenic bladder, NOS   . Spina bifida without mention of hydrocephalus,  unspecified region   . UTI (lower urinary tract infection)     Patient Active Problem List   Diagnosis Date Noted  . Memory difficulty 11/05/2017  . Peripheral vascular disease, unspecified ([place]) 12/18/2011    Past Surgical History:  Procedure Laterality Date  . COLONOSCOPY  2001  . CSF SHUNT          Home Medications    Prior to Admission medications   Medication Sig Start Date End Date Taking? Authorizing Provider  acyclovir (ZOVIRAX) 400 MG tablet Take 400 mg by mouth 3 (three) times daily.    [provider]  atenolol (TENORMIN) 25 MG tablet Take by mouth.    [provider]  cetirizine (ZYRTEC) 10 MG tablet Take 10 mg by mouth daily.    [provider]  levothyroxine (SYNTHROID, LEVOTHROID) 75 MCG tablet TK 1 T PO QD 09/03/17   [provider]  olmesartan (BENICAR) 40 MG tablet Take 40 mg by mouth daily.    [provider]  olmesartan-hydrochlorothiazide (BENICAR HCT) 40-25 MG tablet Take 1 tablet by mouth daily.    [provider]  omeprazole (PRILOSEC) 20 MG capsule Take by mouth.    [provider]  OVER THE COUNTER MEDICATION Rexall saline nasal spray    [provider]  OVER THE COUNTER MEDICATION Micro-Guard Antifungal Powder with Miconazole Nitrate 2%  [provider]  vitamin B-12 (CYANOCOBALAMIN) 100 MCG tablet Take 100 mcg by mouth daily.    [provider]    Family History Family History  Problem Relation Age of Onset  . Alzheimer's disease Mother   . Hyperlipidemia Mother   . Dementia Mother   . Cataracts Father   . Hypertension Father   . Kidney failure Father   . Atrial fibrillation Father   . Other Sister        varicose veins    Social History Social History   Tobacco Use  . Smoking status: Never Smoker  Substance Use Topics  . Alcohol use: No  . Drug use: No     Allergies   Amlodipine, Amoxicillin, Doxycycline, Sulfa antibiotics, Cefuroxime  axetil, Coricidin d cold-flu-sinus [chlorphen-pe-acetaminophen], Famotidine, Fish oil, and Folic acid   Review of Systems Review of Systems  Constitutional: Positive for chills. Negative for diaphoresis.  Gastrointestinal: Positive for abdominal pain and diarrhea. Negative for vomiting.  Musculoskeletal: Negative for myalgias.  All other systems reviewed and are negative.    Physical Exam Updated Vital Signs BP (!) 142/74 (BP Location: Right Arm)   Pulse 74   Temp 100 F (37.8 C)   Resp 18   SpO2 98%   Physical Exam Vitals signs and nursing note reviewed.  Constitutional:      General: He is not in acute distress.    Appearance: He is well-developed and normal weight.  HENT:     Head: Normocephalic and atraumatic.  Eyes:     Conjunctiva/sclera: Conjunctivae normal.     Pupils: Pupils are equal, round, and reactive to light.  Neck:     Musculoskeletal: Normal range of motion and neck supple.  Cardiovascular:     Rate and Rhythm: Normal rate and regular rhythm.     Pulses: Normal pulses.     Heart sounds: No murmur.  Pulmonary:     Effort: Pulmonary effort is normal. No respiratory distress.     Breath sounds: Normal breath sounds. No wheezing or rales.  Abdominal:     General: There is no distension.     Palpations: Abdomen is soft.     Tenderness: There is abdominal tenderness in the right lower quadrant and periumbilical area. There is no right CVA tenderness, left CVA tenderness, guarding or rebound.  Musculoskeletal: Normal range of motion.        General: No tenderness.  Skin:    General: Skin is warm and dry.     Capillary Refill: Capillary refill takes less than 2 seconds.     Findings: No erythema or rash.  Neurological:     Mental Status: He is alert and oriented to person, place, and time.     Comments: Paraplegia  Psychiatric:        Mood and Affect: Mood normal.        Behavior: Behavior normal.        Thought Content: Thought content normal.       ED Treatments / Results  Labs (all labs ordered are listed, but only abnormal results are displayed) Labs Reviewed  COMPREHENSIVE METABOLIC PANEL - Abnormal; Notable for the following components:      Result Value   Sodium 134 (*)    Potassium 3.1 (*)    Chloride 97 (*)    Glucose, Bld 116 (*)    BUN 24 (*)    All other components within normal limits  CBC - Abnormal; Notable for the following components:  Platelets 135 (*)    All other components within normal limits  SARS CORONAVIRUS 2 (TAT 6-24 HRS)  LIPASE, BLOOD  URINALYSIS, ROUTINE W REFLEX MICROSCOPIC    EKG None  Radiology No results found.  Procedures Procedures (including critical care time)  Medications Ordered in ED Medications  lactated ringers bolus 1,000 mL (1,000 mLs Intravenous New Bag/Given 10/04/18 2216)  potassium chloride SA (K-DUR) CR tablet 40 mEq (40 mEq Oral Given 10/04/18 2208)     Initial Impression / Assessment and Plan / ED Course  I have reviewed the triage vital signs and the nursing notes.  Pertinent labs & imaging results that were available during my care of the patient were reviewed by me and considered in my medical decision making (see chart for details).       Patient is a 58 year old male with spina bifida presenting with 4-day history of diarrhea, cough and congestion.  Patient is well-appearing on exam but states had some bright red blood streaks in his stool today.  Patient has some mild right-sided abdominal pain but no right upper quadrant pain concerning for gallbladder pathology.  Lungs are clear and he states the cough is dry.  He has no known COVID contacts but could be related to [place].  Low suspicion for C. difficile colitis.  Possible diverticulitis versus infectious diarrhea.  Patient is mildly hypokalemic at 3.1 which is probably related to the diarrhea.  Lipase is within normal limits and CBC normal.  Patient is satting 98% on room air.  Patient given IV fluids and  potassium replacement.  CT is pending and chest x-ray.  Final Clinical Impressions(s) / ED Diagnoses   Final diagnoses:  None    ED Discharge Orders    None       Blanchie Dessert, MD 10/04/18 2317

## 2018-10-05 ENCOUNTER — Emergency Department (HOSPITAL_COMMUNITY): Payer: Medicare Other

## 2018-10-05 DIAGNOSIS — R197 Diarrhea, unspecified: Secondary | ICD-10-CM | POA: Diagnosis not present

## 2018-10-05 LAB — URINALYSIS, ROUTINE W REFLEX MICROSCOPIC
Bilirubin Urine: NEGATIVE
Glucose, UA: NEGATIVE mg/dL
Ketones, ur: NEGATIVE mg/dL
Nitrite: NEGATIVE
Protein, ur: NEGATIVE mg/dL
Specific Gravity, Urine: 1.01 (ref 1.005–1.030)
WBC, UA: >50 WBC/hpf — ABNORMAL HIGH (ref 0–5)
pH: 6 (ref 5.0–8.0)

## 2018-10-05 LAB — SARS CORONAVIRUS 2 (TAT 6-24 HRS): SARS Coronavirus 2: NEGATIVE

## 2018-10-05 MED ORDER — CIPROFLOXACIN HCL 500 MG PO TABS: 500.0000 mg | ORAL_TABLET | Freq: Two times a day (BID) | ORAL | 0 refills | Status: DC

## 2018-10-05 MED ORDER — CIPROFLOXACIN IN D5W 400 MG/200ML IV SOLN
400.0000 mg | Freq: Once | INTRAVENOUS | Status: DC
  Filled 2018-10-05: qty 200

## 2018-10-05 NOTE — Discharge Instructions (Signed)
Take the antibiotic as prescribed and follow-up with the urologist.  Return to the ED if you have worsening pain, fever, vomiting, any other concerns. Keep yourself quarantined at home while your coronavirus test is pending.

## 2018-10-05 NOTE — ED Provider Notes (Signed)
Care assumed from Dr. Maryan Rued.  Patient with history of neurogenic bladder presenting with a 3-day history of diarrhea, cough chest congestion and cough.  T-max 100.  Does self catheterize.  He is awaiting CT scan of his abdomen as well as urinalysis.  Urinalysis is consistent with infection but he does self catheterize.  Culture is sent.  CT scan shows evidence of cystitis with thickening of the bladder wall.  Given the symptoms and his patient's fever will plan to treat with antibiotics.  Will give Cipro given his allergies. His chest x-ray is negative.  Coronavirus swab is pending.  Hypokalemia was repleted.  Patient tolerating p.o. and does not appear to be septic. He appears stable for outpatient treatment and urology followup. Return precautions discussed. Instructed to quarantine while coronavirus test is pending.   Georgina Peer was evaluated in Emergency Department on 10/05/2018 for the symptoms described in the history of present illness. He was evaluated in the context of the global COVID-19 pandemic, which necessitated consideration that the patient might be at risk for infection with the SARS-CoV-2 virus that causes COVID-19. Institutional protocols and algorithms that pertain to the evaluation of patients at risk for COVID-19 are in a state of rapid change based on information released by regulatory bodies including the CDC and federal and state organizations. These policies and algorithms were followed during the patient's care in the ED.   BP 132/73   Pulse 63   Temp 100 F (37.8 C)   Resp 17   SpO2 98%     Ezequiel Essex, MD 10/05/18 [phone]

## 2018-10-06 DIAGNOSIS — N319 Neuromuscular dysfunction of bladder, unspecified: Secondary | ICD-10-CM | POA: Diagnosis not present

## 2018-10-07 ENCOUNTER — Other Ambulatory Visit: Payer: Self-pay | Admitting: Urology

## 2018-10-08 NOTE — Patient Instructions (Addendum)
DUE TO COVID-19 ONLY ONE VISITOR IS ALLOWED TO COME WITH YOU AND STAY IN THE WAITING ROOM ONLY DURING PRE OP AND PROCEDURE DAY OF SURGERY. THE 1 VISITOR MAY VISIT WITH YOU AFTER SURGERY IN YOUR PRIVATE ROOM DURING VISITING HOURS ONLY!  YOU HAVE COMPLETED YOUR COVID TEST. PLEASE BEGIN THE QUARANTINE INSTRUCTIONS AS OUTLINED IN YOUR HANDOUT.                Kenneth Arroyo   Your procedure is scheduled on: 10-15-18   Report to [facility] Main  Entrance    Report to Short Stay  at 5:30 AM     Call this number if you have problems the morning of surgery [phone]    Remember: Do not eat food or drink liquids :After Midnight.     Take these medicines the morning of surgery with A SIP OF WATER: Atenolol (Tenormin), Cetirizine (Zyrtec), Levothyroxine (Synthroid), Omeprazole (Prilosec)  BRUSH YOUR TEETH MORNING OF SURGERY AND RINSE YOUR MOUTH OUT, NO CHEWING GUM CANDY OR MINTS.                                  You may not have any metal on your body including hair pins and              piercings     Do not wear jewelry, cologne, lotions, powders or deodorant              Men may shave face and neck.   Do not bring valuables to the hospital. [place].  Contacts, dentures or bridgework may not be worn into surgery.  YOU MAY BRING A SMALL OVERNIGHT BAG               Please read over the following fact sheets you were given: _____________________________________________________________________             [facility] - Preparing for Surgery Before surgery, you can play an important role.  Because skin is not sterile, your skin needs to be as free of germs as possible.  You can reduce the number of germs on your skin by washing with CHG (chlorahexidine gluconate) soap before surgery.  CHG is an antiseptic cleaner which kills germs and bonds with the skin to continue killing germs even after washing. Please DO NOT use if you  have an allergy to CHG or antibacterial soaps.  If your skin becomes reddened/irritated stop using the CHG and inform your nurse when you arrive at Short Stay. Do not shave (including legs and underarms) for at least 48 hours prior to the first CHG shower.  You may shave your face/neck. Please follow these instructions carefully:  1.  Shower with CHG Soap the night before surgery and the  morning of Surgery.  2.  If you choose to wash your hair, wash your hair first as usual with your  normal  shampoo.  3.  After you shampoo, rinse your hair and body thoroughly to remove the  shampoo.                           4.  Use CHG as you would any other liquid soap.  You can apply chg directly  to the skin and wash  Gently with a scrungie or clean washcloth.  5.  Apply the CHG Soap to your body ONLY FROM THE NECK DOWN.   Do not use on face/ open                           Wound or open sores. Avoid contact with eyes, ears mouth and genitals (private parts).                       Wash face,  Genitals (private parts) with your normal soap.             6.  Wash thoroughly, paying special attention to the area where your surgery  will be performed.  7.  Thoroughly rinse your body with warm water from the neck down.  8.  DO NOT shower/wash with your normal soap after using and rinsing off  the CHG Soap.                9.  Pat yourself dry with a clean towel.            10.  Wear clean pajamas.            11.  Place clean sheets on your bed the night of your first shower and do not  sleep with pets. Day of Surgery : Do not apply any lotions/deodorants the morning of surgery.  Please wear clean clothes to the hospital/surgery center.  FAILURE TO FOLLOW THESE INSTRUCTIONS MAY RESULT IN THE CANCELLATION OF YOUR SURGERY PATIENT SIGNATURE_________________________________  NURSE  SIGNATURE__________________________________  ________________________________________________________________________

## 2018-10-08 NOTE — Progress Notes (Signed)
Please place surgical orders. Patient is scheduled for PAT appointment on 10-09-18.

## 2018-10-12 ENCOUNTER — Other Ambulatory Visit: Payer: Self-pay

## 2018-10-12 ENCOUNTER — Other Ambulatory Visit (HOSPITAL_COMMUNITY)
Admission: RE | Admit: 2018-10-12 | Discharge: 2018-10-12 | Disposition: A | Discharge status: Home / Self Care | Payer: Medicare Other | Source: Ambulatory Visit | Attending: Urology | Admitting: Urology

## 2018-10-12 ENCOUNTER — Encounter (HOSPITAL_COMMUNITY)
Admission: RE | Admit: 2018-10-12 | Discharge: 2018-10-12 | Disposition: A | Discharge status: Home / Self Care | Payer: Medicare Other | Source: Ambulatory Visit | Attending: Urology | Admitting: Urology

## 2018-10-12 ENCOUNTER — Encounter (HOSPITAL_COMMUNITY): Payer: Self-pay

## 2018-10-12 DIAGNOSIS — R001 Bradycardia, unspecified: Secondary | ICD-10-CM | POA: Diagnosis not present

## 2018-10-12 DIAGNOSIS — Z01810 Encounter for preprocedural cardiovascular examination: Secondary | ICD-10-CM | POA: Insufficient documentation

## 2018-10-12 DIAGNOSIS — N329 Bladder disorder, unspecified: Secondary | ICD-10-CM | POA: Diagnosis not present

## 2018-10-12 DIAGNOSIS — Z01818 Encounter for other preprocedural examination: Secondary | ICD-10-CM | POA: Insufficient documentation

## 2018-10-12 DIAGNOSIS — N35919 Unspecified urethral stricture, male, unspecified site: Secondary | ICD-10-CM | POA: Diagnosis not present

## 2018-10-12 DIAGNOSIS — Z20828 Contact with and (suspected) exposure to other viral communicable diseases: Secondary | ICD-10-CM | POA: Diagnosis not present

## 2018-10-12 HISTORY — DX: Other specified health status: Z78.9

## 2018-10-12 HISTORY — DX: Personal history of (healed) traumatic fracture: Z87.81

## 2018-10-12 LAB — BASIC METABOLIC PANEL
Anion gap: 9 (ref 5–15)
BUN: 12 mg/dL (ref 6–20)
CO2: 30 mmol/L (ref 22–32)
Calcium: 9.3 mg/dL (ref 8.9–10.3)
Chloride: 101 mmol/L (ref 98–111)
Creatinine, Ser: 0.8 mg/dL (ref 0.61–1.24)
GFR calc Af Amer: >60 mL/min (ref >60)
GFR calc non Af Amer: >60 mL/min (ref >60)
Glucose, Bld: 94 mg/dL (ref 70–99)
Potassium: 3.6 mmol/L (ref 3.5–5.1)
Sodium: 140 mmol/L (ref 135–145)

## 2018-10-12 LAB — CBC
HCT: 39.5 % (ref 39.0–52.0)
Hemoglobin: 13.3 g/dL (ref 13.0–17.0)
MCH: 30.6 pg (ref 26.0–34.0)
MCHC: 33.7 g/dL (ref 30.0–36.0)
MCV: 91 fL (ref 80.0–100.0)
Platelets: 205 10*3/uL (ref 150–400)
RBC: 4.34 MIL/uL (ref 4.22–5.81)
RDW: 13 % (ref 11.5–15.5)
WBC: 8.2 10*3/uL (ref 4.0–10.5)
nRBC: 0 % (ref 0.0–0.2)

## 2018-10-12 NOTE — Pre-Procedure Instructions (Signed)
PCP - Deland Pretty, MD Cardiologist -   Chest x-ray - Epic 2020 EKG - DONE TODAY  Stress Test -  ECHO -  Cardiac Cath -   Sleep Study -  CPAP -   Fasting Blood Sugar -  Checks Blood Sugar _____ times a day  Blood Thinner Instructions: Aspirin Instructions: Last Dose:  Anesthesia review:   HX OF SPINA BIFIDA AND PARAPLEGIA, UNDERWENT CSF SHUNT IN INFANCY . LIVES AT HOME AND IS SELF DEPENDENT. DOES NOT REPORT ANY CURRENT CARDIAC OR PULMONARY ISSUES.   Patient denies shortness of breath, fever, cough and chest pain at PAT appointment   Patient verbalized understanding of instructions that were given to them at the PAT appointment. Patient was also instructed that they will need to review over the PAT instructions again at home before surgery.

## 2018-10-13 LAB — NOVEL CORONAVIRUS, NAA (HOSP ORDER, SEND-OUT TO REF LAB; TAT 18-24 HRS): SARS-CoV-2, NAA: NOT DETECTED

## 2018-10-15 ENCOUNTER — Ambulatory Visit (HOSPITAL_COMMUNITY): Payer: Medicare Other | Admitting: Physician Assistant

## 2018-10-15 ENCOUNTER — Ambulatory Visit (HOSPITAL_COMMUNITY): Payer: Medicare Other | Admitting: Certified Registered"

## 2018-10-15 ENCOUNTER — Other Ambulatory Visit: Payer: Self-pay

## 2018-10-15 ENCOUNTER — Encounter (HOSPITAL_COMMUNITY): Payer: Self-pay

## 2018-10-15 ENCOUNTER — Ambulatory Visit (HOSPITAL_COMMUNITY): Payer: Medicare Other

## 2018-10-15 ENCOUNTER — Observation Stay (HOSPITAL_COMMUNITY)
Admission: RE | Admit: 2018-10-15 | Discharge: 2018-10-16 | Disposition: A | Discharge status: Home / Self Care | Payer: Medicare Other | Attending: Urology | Admitting: Urology

## 2018-10-15 ENCOUNTER — Encounter (HOSPITAL_COMMUNITY)
Admission: RE | Disposition: A | Discharge status: Home / Self Care | Payer: Self-pay | Source: Home / Self Care | Attending: Urology

## 2018-10-15 DIAGNOSIS — N329 Bladder disorder, unspecified: Secondary | ICD-10-CM | POA: Diagnosis not present

## 2018-10-15 DIAGNOSIS — Q059 Spina bifida, unspecified: Secondary | ICD-10-CM | POA: Diagnosis not present

## 2018-10-15 DIAGNOSIS — I10 Essential (primary) hypertension: Secondary | ICD-10-CM | POA: Insufficient documentation

## 2018-10-15 DIAGNOSIS — Z79890 Hormone replacement therapy (postmenopausal): Secondary | ICD-10-CM | POA: Insufficient documentation

## 2018-10-15 DIAGNOSIS — N35819 Other urethral stricture, male, unspecified site: Secondary | ICD-10-CM | POA: Diagnosis not present

## 2018-10-15 DIAGNOSIS — K219 Gastro-esophageal reflux disease without esophagitis: Secondary | ICD-10-CM | POA: Diagnosis not present

## 2018-10-15 DIAGNOSIS — N35919 Unspecified urethral stricture, male, unspecified site: Secondary | ICD-10-CM | POA: Diagnosis not present

## 2018-10-15 DIAGNOSIS — I739 Peripheral vascular disease, unspecified: Secondary | ICD-10-CM | POA: Diagnosis not present

## 2018-10-15 DIAGNOSIS — N319 Neuromuscular dysfunction of bladder, unspecified: Secondary | ICD-10-CM | POA: Diagnosis present

## 2018-10-15 DIAGNOSIS — N3289 Other specified disorders of bladder: Secondary | ICD-10-CM | POA: Diagnosis not present

## 2018-10-15 DIAGNOSIS — Z79899 Other long term (current) drug therapy: Secondary | ICD-10-CM | POA: Insufficient documentation

## 2018-10-15 HISTORY — PX: CYSTOSCOPY WITH URETHRAL DILATATION: SHX5125

## 2018-10-15 HISTORY — PX: TRANSURETHRAL RESECTION OF BLADDER TUMOR: SHX2575

## 2018-10-15 LAB — HIV ANTIBODY (ROUTINE TESTING W REFLEX): HIV Screen 4th Generation wRfx: NONREACTIVE

## 2018-10-15 SURGERY — CYSTOSCOPY, WITH URETHRAL DILATION
Anesthesia: General | Site: Urethra

## 2018-10-15 MED ORDER — PROMETHAZINE HCL 25 MG/ML IJ SOLN: 6.2500 mg | INTRAMUSCULAR | Status: DC | PRN

## 2018-10-15 MED ORDER — LIDOCAINE HCL URETHRAL/MUCOSAL 2 % EX GEL
CUTANEOUS | Status: DC | PRN
  Administered 2018-10-15: 1 via URETHRAL

## 2018-10-15 MED ORDER — IBUPROFEN 100 MG/5ML PO SUSP
200.0000 mg | Freq: Four times a day (QID) | ORAL | Status: DC | PRN
  Filled 2018-10-15: qty 20

## 2018-10-15 MED ORDER — SUCCINYLCHOLINE CHLORIDE 200 MG/10ML IV SOSY
PREFILLED_SYRINGE | INTRAVENOUS | Status: AC
  Filled 2018-10-15: qty 10

## 2018-10-15 MED ORDER — EPHEDRINE SULFATE-NACL 50-0.9 MG/10ML-% IV SOSY
PREFILLED_SYRINGE | INTRAVENOUS | Status: DC | PRN
  Administered 2018-10-15 (×2): 10 mg via INTRAVENOUS

## 2018-10-15 MED ORDER — ONDANSETRON HCL 4 MG/2ML IJ SOLN
INTRAMUSCULAR | Status: AC
  Filled 2018-10-15: qty 2

## 2018-10-15 MED ORDER — MIDAZOLAM HCL 5 MG/5ML IJ SOLN
INTRAMUSCULAR | Status: DC | PRN
  Administered 2018-10-15: 2 mg via INTRAVENOUS

## 2018-10-15 MED ORDER — IRBESARTAN 300 MG PO TABS
300.0000 mg | ORAL_TABLET | Freq: Every day | ORAL | Status: DC
  Administered 2018-10-16: 09:00:00 300 mg via ORAL
  Filled 2018-10-15: qty 1

## 2018-10-15 MED ORDER — OXYCODONE HCL 5 MG PO TABS: 5.0000 mg | ORAL_TABLET | ORAL | Status: DC | PRN

## 2018-10-15 MED ORDER — PROPOFOL 10 MG/ML IV BOLUS
INTRAVENOUS | Status: AC
  Filled 2018-10-15: qty 20

## 2018-10-15 MED ORDER — IBUPROFEN 200 MG PO TABS: 200.0000 mg | ORAL_TABLET | Freq: Four times a day (QID) | ORAL | Status: DC | PRN

## 2018-10-15 MED ORDER — HYDROMORPHONE HCL 1 MG/ML IJ SOLN: 0.5000 mg | INTRAMUSCULAR | Status: DC | PRN

## 2018-10-15 MED ORDER — LIDOCAINE 2% (20 MG/ML) 5 ML SYRINGE
INTRAMUSCULAR | Status: DC | PRN
  Administered 2018-10-15: 75 mg via INTRAVENOUS

## 2018-10-15 MED ORDER — FLEET ENEMA 7-19 GM/118ML RE ENEM: 1.0000 | ENEMA | Freq: Once | RECTAL | Status: DC | PRN

## 2018-10-15 MED ORDER — BISACODYL 10 MG RE SUPP: 10.0000 mg | Freq: Every day | RECTAL | Status: DC | PRN

## 2018-10-15 MED ORDER — SODIUM CHLORIDE 0.9 % IR SOLN
Status: DC | PRN
  Administered 2018-10-15: 3000 mL

## 2018-10-15 MED ORDER — LIDOCAINE 2% (20 MG/ML) 5 ML SYRINGE
INTRAMUSCULAR | Status: AC
  Filled 2018-10-15: qty 5

## 2018-10-15 MED ORDER — OXYCODONE HCL 5 MG/5ML PO SOLN: 5.0000 mg | Freq: Once | ORAL | Status: DC | PRN

## 2018-10-15 MED ORDER — FENTANYL CITRATE (PF) 100 MCG/2ML IJ SOLN
INTRAMUSCULAR | Status: DC | PRN
  Administered 2018-10-15 (×2): 50 ug via INTRAVENOUS

## 2018-10-15 MED ORDER — EPHEDRINE 5 MG/ML INJ
INTRAVENOUS | Status: AC
  Filled 2018-10-15: qty 10

## 2018-10-15 MED ORDER — PANTOPRAZOLE SODIUM 40 MG PO TBEC
40.0000 mg | DELAYED_RELEASE_TABLET | Freq: Every day | ORAL | Status: DC
  Administered 2018-10-16: 09:00:00 40 mg via ORAL
  Filled 2018-10-15: qty 1

## 2018-10-15 MED ORDER — ZOLPIDEM TARTRATE 5 MG PO TABS: 5.0000 mg | ORAL_TABLET | Freq: Every evening | ORAL | Status: DC | PRN

## 2018-10-15 MED ORDER — ACETAMINOPHEN 500 MG PO TABS
1000.0000 mg | ORAL_TABLET | Freq: Four times a day (QID) | ORAL | Status: DC | PRN
  Administered 2018-10-15: 18:00:00 1000 mg via ORAL
  Filled 2018-10-15: qty 2

## 2018-10-15 MED ORDER — LACTATED RINGERS IV SOLN
INTRAVENOUS | Status: DC
  Administered 2018-10-15: 07:00:00 via INTRAVENOUS

## 2018-10-15 MED ORDER — LEVOTHYROXINE SODIUM 75 MCG PO TABS
75.0000 ug | ORAL_TABLET | Freq: Every day | ORAL | Status: DC
  Administered 2018-10-16: 06:00:00 75 ug via ORAL
  Filled 2018-10-15: qty 1

## 2018-10-15 MED ORDER — FENTANYL CITRATE (PF) 100 MCG/2ML IJ SOLN: 25.0000 ug | INTRAMUSCULAR | Status: DC | PRN

## 2018-10-15 MED ORDER — HYDROCHLOROTHIAZIDE 25 MG PO TABS
25.0000 mg | ORAL_TABLET | Freq: Every day | ORAL | Status: DC
  Administered 2018-10-16: 25 mg via ORAL
  Filled 2018-10-15: qty 1

## 2018-10-15 MED ORDER — 0.9 % SODIUM CHLORIDE (POUR BTL) OPTIME
TOPICAL | Status: DC | PRN
  Administered 2018-10-15: 1000 mL

## 2018-10-15 MED ORDER — FENTANYL CITRATE (PF) 100 MCG/2ML IJ SOLN
INTRAMUSCULAR | Status: AC
  Filled 2018-10-15: qty 2

## 2018-10-15 MED ORDER — OXYCODONE HCL 5 MG PO TABS: 5.0000 mg | ORAL_TABLET | Freq: Once | ORAL | Status: DC | PRN

## 2018-10-15 MED ORDER — CHLORHEXIDINE GLUCONATE CLOTH 2 % EX PADS
6.0000 | MEDICATED_PAD | Freq: Every day | CUTANEOUS | Status: DC
  Administered 2018-10-15 – 2018-10-16 (×2): 6 via TOPICAL

## 2018-10-15 MED ORDER — CIPROFLOXACIN IN D5W 400 MG/200ML IV SOLN
400.0000 mg | Freq: Two times a day (BID) | INTRAVENOUS | Status: DC
  Administered 2018-10-15: 08:00:00 400 mg via INTRAVENOUS
  Filled 2018-10-15: qty 200

## 2018-10-15 MED ORDER — STERILE WATER FOR IRRIGATION IR SOLN
Status: DC | PRN
  Administered 2018-10-15: 3000 mL

## 2018-10-15 MED ORDER — ONDANSETRON HCL 4 MG/2ML IJ SOLN: 4.0000 mg | INTRAMUSCULAR | Status: DC | PRN

## 2018-10-15 MED ORDER — OLMESARTAN MEDOXOMIL-HCTZ 40-25 MG PO TABS: 1.0000 | ORAL_TABLET | Freq: Every day | ORAL | Status: DC

## 2018-10-15 MED ORDER — DEXAMETHASONE SODIUM PHOSPHATE 10 MG/ML IJ SOLN
INTRAMUSCULAR | Status: DC | PRN
  Administered 2018-10-15: 10 mg via INTRAVENOUS

## 2018-10-15 MED ORDER — PROPOFOL 10 MG/ML IV BOLUS
INTRAVENOUS | Status: DC | PRN
  Administered 2018-10-15: 200 mg via INTRAVENOUS

## 2018-10-15 MED ORDER — LORATADINE 10 MG PO TABS
10.0000 mg | ORAL_TABLET | Freq: Every day | ORAL | Status: DC
  Filled 2018-10-15: qty 1

## 2018-10-15 MED ORDER — POTASSIUM CHLORIDE IN NACL 20-0.45 MEQ/L-% IV SOLN
INTRAVENOUS | Status: DC
  Administered 2018-10-15 – 2018-10-16 (×2): via INTRAVENOUS
  Filled 2018-10-15 (×3): qty 1000

## 2018-10-15 MED ORDER — SENNOSIDES-DOCUSATE SODIUM 8.6-50 MG PO TABS: 1.0000 | ORAL_TABLET | Freq: Every evening | ORAL | Status: DC | PRN

## 2018-10-15 MED ORDER — LIDOCAINE HCL URETHRAL/MUCOSAL 2 % EX GEL
CUTANEOUS | Status: AC
  Filled 2018-10-15: qty 5

## 2018-10-15 MED ORDER — MIDAZOLAM HCL 2 MG/2ML IJ SOLN
INTRAMUSCULAR | Status: AC
  Filled 2018-10-15: qty 2

## 2018-10-15 MED ORDER — DEXAMETHASONE SODIUM PHOSPHATE 10 MG/ML IJ SOLN
INTRAMUSCULAR | Status: AC
  Filled 2018-10-15: qty 1

## 2018-10-15 MED ORDER — MEPERIDINE HCL 50 MG/ML IJ SOLN: 6.2500 mg | INTRAMUSCULAR | Status: DC | PRN

## 2018-10-15 MED ORDER — CIPROFLOXACIN HCL 500 MG PO TABS
500.0000 mg | ORAL_TABLET | Freq: Two times a day (BID) | ORAL | Status: DC
  Filled 2018-10-15 (×2): qty 1

## 2018-10-15 MED ORDER — ATENOLOL 50 MG PO TABS
50.0000 mg | ORAL_TABLET | Freq: Every day | ORAL | Status: DC
  Administered 2018-10-16: 50 mg via ORAL
  Filled 2018-10-15: qty 1

## 2018-10-15 SURGICAL SUPPLY — 25 items
BAG URINE DRAINAGE (UROLOGICAL SUPPLIES) IMPLANT
BAG URO CATCHER STRL LF (MISCELLANEOUS) ×3 IMPLANT
BALLN NEPHROSTOMY (BALLOONS) ×3
BALLOON NEPHROSTOMY (BALLOONS) IMPLANT
CATH COUDE 5CC RIBBED (CATHETERS) IMPLANT
CATH FOLEY 3WAY 30CC 22FR (CATHETERS) IMPLANT
CATH RIBBED COUDE 5CC (CATHETERS) ×1
CATH ROBINSON RED A/P 16FR (CATHETERS) IMPLANT
CATH URET 5FR 28IN OPEN ENDED (CATHETERS) IMPLANT
CLOTH BEACON ORANGE TIMEOUT ST (SAFETY) ×3 IMPLANT
COVER WAND RF STERILE (DRAPES) IMPLANT
GLOVE SURG SS PI 8.0 STRL IVOR (GLOVE) IMPLANT
GOWN STRL REUS W/TWL XL LVL3 (GOWN DISPOSABLE) ×3 IMPLANT
GUIDEWIRE STR DUAL SENSOR (WIRE) ×3 IMPLANT
HOLDER FOLEY CATH W/STRAP (MISCELLANEOUS) IMPLANT
KIT TURNOVER KIT A (KITS) IMPLANT
LOOP CUT BIPOLAR 24F LRG (ELECTROSURGICAL) IMPLANT
MANIFOLD NEPTUNE II (INSTRUMENTS) ×3 IMPLANT
PACK CYSTO (CUSTOM PROCEDURE TRAY) ×3 IMPLANT
SET ASPIRATION TUBING (TUBING) ×3 IMPLANT
SUT ETHILON 3 0 PS 1 (SUTURE) IMPLANT
SYR 30ML LL (SYRINGE) IMPLANT
SYRINGE IRR TOOMEY STRL 70CC (SYRINGE) IMPLANT
TUBING CONNECTING 10 (TUBING) ×3 IMPLANT
TUBING UROLOGY SET (TUBING) ×3 IMPLANT

## 2018-10-15 NOTE — Anesthesia Preprocedure Evaluation (Addendum)
Anesthesia Evaluation  Patient identified by MRN, date of birth, ID band Patient awake    Reviewed: Allergy & Precautions, NPO status , Patient's Chart, lab work & pertinent test results  Airway Mallampati: I       Dental  (+) Poor Dentition,    Pulmonary neg pulmonary ROS,    Pulmonary exam normal breath sounds clear to auscultation       Cardiovascular hypertension, Pt. on home beta blockers negative cardio ROS Normal cardiovascular exam Rhythm:Regular Rate:Normal     Neuro/Psych negative neurological ROS  negative psych ROS   GI/Hepatic Neg liver ROS, GERD  Medicated and Controlled,  Endo/Other  negative endocrine ROS  Renal/GU negative Renal ROS  negative genitourinary   Musculoskeletal negative musculoskeletal ROS (+)   Abdominal (+) + obese,   Peds negative pediatric ROS (+)  Hematology negative hematology ROS (+)   Anesthesia Other Findings   Reproductive/Obstetrics negative OB ROS                            Anesthesia Physical Anesthesia Plan  ASA: II  Anesthesia Plan: General   Post-op Pain Management:    Induction: Intravenous  PONV Risk Score and Plan: 4 or greater and Ondansetron, Dexamethasone and Midazolam  Airway Management Planned: LMA  Additional Equipment: None  Intra-op Plan:   Post-operative Plan: Extubation in OR  Informed Consent: I have reviewed the patients History and Physical, chart, labs and discussed the procedure including the risks, benefits and alternatives for the proposed anesthesia with the patient or authorized representative who has indicated his/her understanding and acceptance.     Dental advisory given  Plan Discussed with: CRNA  Anesthesia Plan Comments:         Anesthesia Quick Evaluation

## 2018-10-15 NOTE — Anesthesia Postprocedure Evaluation (Signed)
Anesthesia Post Note  Patient: Kenneth Arroyo  Procedure(s) Performed: CYSTOSCOPY WITH URETHRAL balloon DILATATION (N/A Urethra) TRANSURETHRAL RESECTION OF BLADDER LESION(TURBT) (N/A Bladder)     Patient location during evaluation: PACU Anesthesia Type: General Level of consciousness: awake Pain management: pain level controlled Vital Signs Assessment: post-procedure vital signs reviewed and stable Respiratory status: spontaneous breathing Cardiovascular status: stable Postop Assessment: no apparent nausea or vomiting Anesthetic complications: no    Last Vitals:  Vitals:   10/15/18 0900 10/15/18 0915  BP: 122/70 127/68  Pulse: (!) 50 (!) 54  Resp: 12 12  Temp:    SpO2: 100% 100%    Last Pain:  Vitals:   10/15/18 0915  TempSrc:   PainSc: 0-No pain   Pain Goal:                   Huston Foley

## 2018-10-15 NOTE — Op Note (Signed)
Procedure: 1.  Cystoscopy with dilation of urethral stricture. 2.  Fluoroscopy utilization. 3.  Cystoscopy with bladder biopsy and fulguration of right lateral wall and bladder neck lesions.  Preop diagnosis: 1.  Proximal urethral stricture. 2.  Bladder wall lesions with keratinizing epithelium.  Postop diagnosis: Same.  Surgeon: Dr. Irine Seal.  Anesthesia: General.  Specimen: Biopsies from the right lateral wall and bladder neck.  Drains: 68 French coud Foley catheter.  EBL: None.  Complications: None.  Indications: Kenneth Arroyo is a 58 year old male with a neurogenic bladder managed with intermittent catheterization.  On recent evaluation he was found to have a dense proximal urethral stricture involving the proximal bulb, membranous urethra and distal prostatic urethra and he was also noted to have lesions of the bladder wall with keratinizing epithelium that were felt to require biopsy to rule out a squamous cell carcinoma.  Procedure: He had been on antibiotic preoperatively with Levaquin and was given Cipro for the procedure.  He was placed in lithotomy position after induction of a general anesthetic and was fitted with PAS hose.  His perineum and genitalia were prepped with Betadine solution and he was draped in usual sterile fashion.  Cystoscopy was performed using a 23 [place] scope and 30 degree lens.  Examination revealed a normal anterior urethra.  Starting in the very proximal bulb there was a dense urethral stricture with significant keratinization that I was not able to bypass with the rigid scope.  A sensor guidewire was passed through the scope into the bladder under fluoroscopic guidance and the scope was removed.  A 15 cm x 24 French high-pressure balloon was placed over the wire and inflated to 20 atm under fluoroscopic guidance.  The waist was rapidly disrupted.  The balloon was inflated and removed.  The cystoscope was then reinserted alongside the wire and was advanced  into the bladder.  The stricture had been sufficiently disrupted.  Inspection of the prostatic urethra revealed a high bladder neck but minimal hyperplasia.  There was some additional keratinizing epithelium on the posterior bladder neck.  Examination of bladder demonstrated mild trabeculation.  There was some diffuse bladder wall erythema most suspicious for chronic inflammatory changes.  On the right lateral wall there were 2 lesions that measured approximately 5 mm and 10 mm and had keratinized epithelium but were not raised or papillary.  The bladder wall lesions in the bladder neck lesion were then biopsied with 2 biopsies taken from the 10 mm lesion and one each from the smaller right lateral wall lesion and bladder neck.  The biopsy sites were then generously fulgurated with a Bugbee electrode.  Inspection after fulguration demonstrated no significant bladder wall perforation and excellent hemostasis.  The cystoscope was removed and a Foley catheter was placed.  An initial attempt to pass a 38 French straight catheter was unsuccessful as the catheter continued to coil at the level of the stricture.  Eventually I was able to place an 38 [place] coud catheter after generous lubrication of the urethra 2% lidocaine jelly.  The catheter balloon was filled with 10 mL of sterile fluid and the catheter was placed to straight drainage.  He was taken down from lithotomy position, his anesthetic was reversed and he was moved recovery in stable condition.  There were no complications.

## 2018-10-15 NOTE — Progress Notes (Signed)
Provider contacted to request diet order. Provider gave V.O to advance diet as tolerated.

## 2018-10-15 NOTE — Transfer of Care (Signed)
Immediate Anesthesia Transfer of Care Note  Patient: Kenneth Arroyo  Procedure(s) Performed: CYSTOSCOPY WITH URETHRAL balloon DILATATION (N/A Urethra) TRANSURETHRAL RESECTION OF BLADDER LESION(TURBT) (N/A Bladder)  Patient Location: PACU  Anesthesia Type:General  Level of Consciousness: awake, drowsy and responds to stimulation  Airway & Oxygen Therapy: Patient Spontanous Breathing and Patient connected to face mask oxygen  Post-op Assessment: Report given to RN and Post -op Vital signs reviewed and stable  Post vital signs: Reviewed and stable  Last Vitals:  Vitals Value Taken Time  BP 114/69 10/15/18 0824  Temp    Pulse 57 10/15/18 0826  Resp 19 10/15/18 0826  SpO2 100 % 10/15/18 0826  Vitals shown include unvalidated device data.  Last Pain:  Vitals:   10/15/18 0556  TempSrc: Oral         Complications: No apparent anesthesia complications

## 2018-10-15 NOTE — Anesthesia Procedure Notes (Signed)
Procedure Name: LMA Insertion Date/Time: 10/15/2018 7:33 AM Performed by: Lollie Sails, CRNA Pre-anesthesia Checklist: Patient identified, Emergency Drugs available, Suction available, Patient being monitored and Timeout performed Patient Re-evaluated:Patient Re-evaluated prior to induction Oxygen Delivery Method: Circle system utilized Preoxygenation: Pre-oxygenation with 100% oxygen Induction Type: IV induction Ventilation: Two handed mask ventilation required LMA: LMA inserted LMA Size: 5.0 Number of attempts: 1 Placement Confirmation: positive ETCO2 and breath sounds checked- equal and bilateral Tube secured with: Tape Dental Injury: Teeth and Oropharynx as per pre-operative assessment  Comments: Poor dentition noted - all teeth intact after insertion of LMA

## 2018-10-15 NOTE — Progress Notes (Signed)
Patient refused to take Ciprofloxacin dose this evening. Pt reports discussed with urology provider, education provided.

## 2018-10-15 NOTE — H&P (Signed)
CC/HPI: 9.29.2020: Here today for follow-up for recent ER visit for diarrhea -- after which he was apparently instructed to consult with a urologist. He reports that this recent ER visit was proceeded by an episode of chills, low-grade fever, and stomach pain lasting 3-4 days with bloody stool/diarrhea on the fourth day. He was prescribed cipro though he was concerned that this could somehow interact with his history of urinary tract infection and has not begun taking this. His diarrhea and chills have not resolved and he states he is still feeling somewhat febrile.   He was previously seen here for hematuria due to improper insertion of his catheter. Since he was last seen he has had only minimal issues with cic, though he will have some occasional issues with insertion. He was previously evaluated by cystoscopy and told he needs to have a bladder biopsy to follow-up on thickening of the bladder wall -- he has been able to schedule this due to issues with transportation. He is using CIC 4-6x day but due to his recent issues with diarrhea he has been doing this less often. He denies any leakage around his catheter, pain on insertion, but does have some occasionally red/orange urine.     ALLERGIES: acetaminophen Amoxicillin TABS cefuroxime cipro famotidine Folic Acid TABS Penicillins Sulfa Drugs    MEDICATIONS: Levothyroxine Sodium  Omeprazole  Acyclovir 400 mg tablet  Atenolol  Cetirizine Hcl  Olmesartan Medoxomil  Vitamin B12     GU PSH: Cystoscopy - 07/20/2018       PSH Notes: Shunting Procedures, Shoulder Surgery, Back Surgery   NON-GU PSH: None   GU PMH: Bladder tumor/neoplasm, There are changes on the bladder wall that are worrisome for a neoplasm, possibly Squamous CA. I am going to get him set up for a cystoscopy with urethral dilation and TUR biopsy. The risks were reviewed. - 07/20/2018 Gross hematuria - 07/20/2018, He has recent gross hematuria that could have been from a  urethral injury related to his fall but he is overdue for upper tract imaging so I am going to get a CT and have him return for cystoscopy. , - 06/29/2018 Membranous urethral stricture, He has a chronic stricture that is somewhat woody. I will dilated him at the time of the procedure. - 07/20/2018 Bladder, Neuromuscular dysfunction, Unspec, Neurogenic bladder - 2016 Chronic cystitis (w/o hematuria), Chronic cystitis - 2016 Urinary Tract Inf, Unspec site, Urinary tract infection - 2015    NON-GU PMH: Encounter for general adult medical examination without abnormal findings, Encounter for preventive health examination - 2016 Pleurodynia, Rib pain - 2015 Personal history of other diseases of the circulatory system, History of hypertension - 2014 Personal history of other endocrine, nutritional and metabolic disease, History of hypercholesterolemia - 2014 Personal history of other specified (corrected) congenital malformations of nervous system and sense organs, History of spina bifida - 2014 Personal history of other specified conditions, History of heartburn - 2014 GERD Hypertension    FAMILY HISTORY: Alzheimer's Disease - Mother Atrial Fibrillation - Father Death In The Family Mother - Mother Family Health Status - Father alive at age 78 - Father Family Health Status Number - No Family History Prophylaxis Of Retinal Detachment - Father   SOCIAL HISTORY: Marital Status: Single Preferred Language: English; Race: White Current Smoking Status: Patient has never smoked.   Tobacco Use Assessment Completed: Used Tobacco in last 30 days? Drinks 2 caffeinated drinks per day.     Notes: Never A Smoker, Caffeine Use, Alcohol Use, Tobacco  Use, Marital History - Single, Occupation:   REVIEW OF SYSTEMS:    GU Review Male:   Patient denies frequent urination, hard to postpone urination, burning/ pain with urination, get up at night to urinate, leakage of urine, stream starts and stops, trouble  starting your stream, have to strain to urinate , erection problems, and penile pain.  Gastrointestinal (Upper):   Patient denies nausea, vomiting, and indigestion/ heartburn.  Gastrointestinal (Lower):   Patient reports diarrhea. Patient denies constipation.  Constitutional:   Patient reports fever. Patient denies night sweats, weight loss, and fatigue.  Skin:   Patient denies itching and skin rash/ lesion.  Eyes:   Patient denies blurred vision and double vision.  Ears/ Nose/ Throat:   Patient denies sore throat and sinus problems.  Hematologic/Lymphatic:   Patient denies swollen glands and easy bruising.  Cardiovascular:   Patient denies leg swelling and chest pains.  Respiratory:   Patient reports cough and shortness of breath.   Endocrine:   Patient denies excessive thirst.  Musculoskeletal:   Patient reports back pain and joint pain.   Neurological:   Patient reports dizziness. Patient denies headaches.  Psychologic:   Patient denies depression and anxiety.   Notes: pt has a cath , CIC . ER for bladder infection , was prescribed Cipro    VITAL SIGNS:      10/06/2018 07:54 AM  BP 114/69 mmHg  Heart Rate 47 /min  Temperature 96.9 F / 36.0 C  Notes: pt unable to stand, weight and height is unknown   MULTI-SYSTEM PHYSICAL EXAMINATION:    Constitutional: Well-nourished. No physical deformities. Normally developed. Good grooming. Uses wheel chair due to spina bifida.  Neck: Neck symmetrical, not swollen. Normal tracheal position.  Respiratory: No labored breathing, no use of accessory muscles.   Skin: No paleness, no jaundice, no cyanosis. No lesion, no ulcer, no rash.  Neurologic / Psychiatric: Oriented to time, oriented to place, oriented to person. No depression, no anxiety, no agitation.  Eyes: Normal conjunctivae. Normal eyelids.  Ears, Nose, Mouth, and Throat: Left ear no scars, no lesions, no masses. Right ear no scars, no lesions, no masses. Nose no scars, no lesions, no  masses. Normal hearing. Normal lips.  Musculoskeletal: Normal gait and station of head and neck.     PAST DATA REVIEWED:  Source Of History:  Patient  Records Review:   Previous Doctor Records, Previous Hospital Records, Previous Patient Records, POC Tool  Urine Test Review:   Urinalysis, Urine Culture   PROCEDURES:         In and Out Cath for Specimen/ Medicare - [phone]  Patient used a 73 French coude catheter, which patient brought with him, was inserted into the bladder using sterile technique. A urine culture was sent to the lab. 500 cc of urine was obtained.   ASSESSMENT:      ICD-10 Details  1 GU:   Bladder, Neuromuscular dysfunction, Unspec - N31.9    PLAN:           Orders Labs CULTURE, URINE          Document Letter(s):  Created for Patient: Clinical Summary         Notes:   Urinary symptoms seem baseline for him currently. He is having alot of diarrhea and GI distress although in general things seem to be improving for him since his ED visit. He was advised that there is likely no need for him to take cipro and was instead advised to  start on probiotic and imodium. He was advised to maintain an adequate fluid intake until his GI sx's resolve. He should also consult with Dr Shelia Media if he continues to feel as though he has a fever or his other sx's persist.    He is concerned with trying to arrange transportation for bladder bx and asks today if he would be able to stay overnight in the hospital -- I will reach out to Dr Jeffie Pollock to discuss options.   Urine culture today.   Keep scheduled follow-up with Dr Jeffie Pollock   cc: Dr Deland Pretty        Next Appointment:      Next Appointment: 10/15/2018 07:30 AM    Appointment Type: Surgery     Location: [facility] [phone]    Provider: Irine Seal, M.D.    Reason for Visit: OBS WL CYSTO U DIL TUR BLD LESION

## 2018-10-16 ENCOUNTER — Encounter (HOSPITAL_COMMUNITY): Payer: Self-pay | Admitting: Urology

## 2018-10-16 DIAGNOSIS — I10 Essential (primary) hypertension: Secondary | ICD-10-CM | POA: Diagnosis not present

## 2018-10-16 DIAGNOSIS — N35919 Unspecified urethral stricture, male, unspecified site: Secondary | ICD-10-CM | POA: Diagnosis not present

## 2018-10-16 DIAGNOSIS — Q059 Spina bifida, unspecified: Secondary | ICD-10-CM | POA: Diagnosis not present

## 2018-10-16 DIAGNOSIS — N3289 Other specified disorders of bladder: Secondary | ICD-10-CM | POA: Diagnosis not present

## 2018-10-16 DIAGNOSIS — K219 Gastro-esophageal reflux disease without esophagitis: Secondary | ICD-10-CM | POA: Diagnosis not present

## 2018-10-16 DIAGNOSIS — Z79899 Other long term (current) drug therapy: Secondary | ICD-10-CM | POA: Diagnosis not present

## 2018-10-16 LAB — SURGICAL PATHOLOGY

## 2018-10-16 NOTE — Discharge Summary (Signed)
Physician Discharge Summary  Patient ID: Kenneth Arroyo MRN: [id number] DOB/AGE: [birth year] 58 y.o.  Admit date: 10/15/2018 Discharge date: 10/16/2018  Admission Diagnoses: Bladder lesion  Discharge Diagnoses:  Active Problems:   Hypervascular lesion of urinary bladder   Discharged Condition: good  Hospital Course:  Patient underwent cystoscopy, dilation of urethral stricture, and bladder biopsy and fulguration of bladder lesions on 10/15/2018.  He was admitted overnight due to not having a ride home.  On postop day 1, he was tolerating p.o., had pain well controlled on p.o. medications, was at baseline mobility, and received instructions on Foley catheter care.  He is being discharged in good condition with Foley catheter to drainage with plan to take this out at home in 3 days which she has been instructed to do.  Discharge Exam: Blood pressure 127/74, pulse (!) 55, temperature 98.1 F (36.7 C), temperature source Oral, resp. rate 16, height 5' (1.524 m), weight 91.4 kg, SpO2 96 %. General: No acute distress CV: Mild bradycardia Pulmonary: Normal work of breathing Abdomen: Soft, non-distended, and nontender GU: Foley catheter in place draining clear yellow urine without clot or hematuria.  Disposition: Discharge disposition: 01-Home or Self Care        Allergies as of 10/16/2018      Reactions   Amlodipine Swelling   Amoxicillin Rash   Did it involve swelling of the face/tongue/throat, SOB, or low BP? No Did it involve sudden or severe rash/hives, skin peeling, or any reaction on the inside of your mouth or nose? No Did you need to seek medical attention at a hospital or doctor's office? Yes When did it last happen?20-30 years ago If all above answers are "NO", may proceed with cephalosporin use.   Doxycycline Rash   Sulfa Antibiotics Rash   Cefuroxime Axetil    Coricidin D Cold-flu-sinus [chlorphen-pe-acetaminophen] Diarrhea   Famotidine Itching   Fish Oil  Diarrhea   Folic Acid Itching      Medication List    TAKE these medications   acetaminophen 500 MG tablet Commonly known as: TYLENOL Take 1,000 mg by mouth every 6 (six) hours as needed for moderate pain.   acyclovir 400 MG tablet Commonly known as: ZOVIRAX Take 400 mg by mouth daily as needed (herpes and shingles).   atenolol 50 MG tablet Commonly known as: TENORMIN Take 50 mg by mouth daily.   cetirizine 10 MG tablet Commonly known as: ZYRTEC Take 10 mg by mouth daily.   ciprofloxacin 500 MG tablet Commonly known as: CIPRO Take 1 tablet (500 mg total) by mouth 2 (two) times daily.   levofloxacin 500 MG tablet Commonly known as: LEVAQUIN Take 500 mg by mouth daily. "Take 1 tablet by mouth every day. Begin taking 2 days before his procedure"   levothyroxine 75 MCG tablet Commonly known as: SYNTHROID Take 75 mcg by mouth daily before breakfast.   olmesartan-hydrochlorothiazide 40-25 MG tablet Commonly known as: BENICAR HCT Take 1 tablet by mouth daily.   omeprazole 20 MG capsule Commonly known as: PRILOSEC Take 20 mg by mouth daily.   vitamin B-12 1000 MCG tablet Commonly known as: CYANOCOBALAMIN Take 1,000 mcg by mouth daily.      Follow-up Information    Irine Seal, MD On 10/29/2018.   Specialty: Urology Why: 3pm Contact information: [address] [place] [address] [phone]           Signed: Haskel Schroeder 10/16/2018, 7:18 AM

## 2018-10-16 NOTE — Plan of Care (Signed)
  Problem: Clinical Measurements: Goal: Diagnostic test results will improve Outcome: Adequate for Discharge   Problem: Coping: Goal: Level of anxiety will decrease Outcome: Adequate for Discharge   Problem: Elimination: Goal: Will not experience complications related to urinary retention Outcome: Adequate for Discharge   Problem: Pain Managment: Goal: General experience of comfort will improve Outcome: Adequate for Discharge   Problem: Education: Goal: Knowledge of General Education information will improve Description: Including pain rating scale, medication(s)/side effects and non-pharmacologic comfort measures Outcome: Completed/Met   Problem: Health Behavior/Discharge Planning: Goal: Ability to manage health-related needs will improve Outcome: Completed/Met   Problem: Clinical Measurements: Goal: Ability to maintain clinical measurements within normal limits will improve Outcome: Completed/Met Goal: Will remain free from infection Outcome: Completed/Met Goal: Respiratory complications will improve Outcome: Completed/Met Goal: Cardiovascular complication will be avoided Outcome: Completed/Met   Problem: Activity: Goal: Risk for activity intolerance will decrease Outcome: Completed/Met   Problem: Nutrition: Goal: Adequate nutrition will be maintained Outcome: Completed/Met   Problem: Elimination: Goal: Will not experience complications related to bowel motility Outcome: Completed/Met

## 2018-10-16 NOTE — TOC Initial Note (Signed)
Transition of Care [facility]) - Initial/Assessment Note    Patient Details  Name: Kenneth Arroyo MRN: [id number] Date of Birth: 12/22/1960  Transition of [facility]) CM/SW Contact:    Dessa Phi, RN Phone Number: 10/16/2018, 1:21 PM  Clinical Narrative:   Paraplegic. Spoke to patient in rm about referral. Patient from home-has a safe home w/brother/has w/c equipped van-he drives on own;has w/c-needs a specialty w/c-[employer] has order to measure for specialty w/c. No further CM needs.                Expected Discharge Plan: Home/Self Care Barriers to Discharge: No Barriers Identified   Patient Goals and CMS Choice Patient states their goals for this hospitalization and ongoing recovery are:: go home      Expected Discharge Plan and Services Expected Discharge Plan: Home/Self Care   Discharge Planning Services: CM Consult   Living arrangements for the past 2 months: Single Family Home Expected Discharge Date: 10/16/18               DME Arranged: Other see comment(measurements for a specialty w/c.) DME Agency: [employer] Date DME Agency Contacted: 10/16/18 Time DME Agency Contacted: [id number]              Prior Living Arrangements/Services Living arrangements for the past 2 months: Single Family Home Lives with:: Siblings Patient language and need for interpreter reviewed:: Yes Do you feel safe going back to the place where you live?: Yes      Need for Family Participation in Patient Care: No (Comment) Care giver support system in place?: Yes (comment) Current home services: DME(w/c) Criminal Activity/Legal Involvement Pertinent to Current Situation/Hospitalization: No - Comment as needed  Activities of Daily Living Home Assistive Devices/Equipment: Wheelchair, Eyeglasses ADL Screening (condition at time of admission) Patient's cognitive ability adequate to safely complete daily activities?: Yes Is the patient deaf or have difficulty hearing?: No Does the patient have  difficulty seeing, even when wearing glasses/contacts?: No Does the patient have difficulty concentrating, remembering, or making decisions?: No Patient able to express need for assistance with ADLs?: Yes Does the patient have difficulty dressing or bathing?: No Independently performs ADLs?: Yes (appropriate for developmental age) Does the patient have difficulty walking or climbing stairs?: Yes(pt hx paraplegia) Weakness of Legs: Both Weakness of Arms/Hands: None  Permission Sought/Granted Permission sought to share information with : Case Manager Permission granted to share information with : Yes, Verbal Permission Granted              Emotional Assessment Appearance:: Appears stated age Attitude/Demeanor/Rapport: Gracious Affect (typically observed): Accepting Orientation: : Oriented to Self, Oriented to Place, Oriented to  Time, Oriented to Situation Alcohol / Substance Use: Not Applicable Psych Involvement: No (comment)  Admission diagnosis:  urethral stricture bladder lesion Patient Active Problem List   Diagnosis Date Noted  . Hypervascular lesion of urinary bladder 10/15/2018  . Memory difficulty 11/05/2017  . Peripheral vascular disease, unspecified ([place]) 12/18/2011   PCP:  Deland Pretty, MD Pharmacy:   [facility] [place], [address] [place] [address] Phone: [phone] Fax: [phone]     Social Determinants of Health (SDOH) Interventions    Readmission Risk Interventions No flowsheet data found.

## 2018-10-16 NOTE — Progress Notes (Signed)
Patient refused 10 pm dose of ciprofloxacin. Patient stated it gives him diarrhea. Urology on call made aware. No new orders placed. VSS. Will continue to monitor closely.

## 2018-10-16 NOTE — Plan of Care (Signed)
  Problem: Education: Goal: Knowledge of General Education information will improve Description: Including pain rating scale, medication(s)/side effects and non-pharmacologic comfort measures Outcome: Progressing   Problem: Health Behavior/Discharge Planning: Goal: Ability to manage health-related needs will improve Outcome: Progressing   Problem: Clinical Measurements: Goal: Ability to maintain clinical measurements within normal limits will improve Outcome: Progressing Goal: Will remain free from infection Outcome: Progressing Goal: Diagnostic test results will improve Outcome: Progressing Goal: Respiratory complications will improve Outcome: Progressing Goal: Cardiovascular complication will be avoided Outcome: Progressing   Problem: Education: Goal: Knowledge of General Education information will improve Description: Including pain rating scale, medication(s)/side effects and non-pharmacologic comfort measures Outcome: Progressing   Problem: Health Behavior/Discharge Planning: Goal: Ability to manage health-related needs will improve Outcome: Progressing   Problem: Clinical Measurements: Goal: Ability to maintain clinical measurements within normal limits will improve Outcome: Progressing Goal: Will remain free from infection Outcome: Progressing Goal: Diagnostic test results will improve Outcome: Progressing Goal: Respiratory complications will improve Outcome: Progressing Goal: Cardiovascular complication will be avoided Outcome: Progressing

## 2018-10-16 NOTE — Discharge Instructions (Signed)
Indwelling Urinary Catheter Care, Adult °An indwelling urinary catheter is a thin tube that is put into your bladder. The tube helps to drain pee (urine) out of your body. The tube goes in through your urethra. Your urethra is where pee comes out of your body. Your pee will come out through the catheter, then it will go into a bag (drainage bag). °Take good care of your catheter so it will work well. °How to wear your catheter and bag °Supplies needed °· Sticky tape (adhesive tape) or a leg strap. °· Alcohol wipe or soap and water (if you use tape). °· A clean towel (if you use tape). °· Large overnight bag. °· Smaller bag (leg bag). °Wearing your catheter °Attach your catheter to your leg with tape or a leg strap. °· Make sure the catheter is not pulled tight. °· If a leg strap gets wet, take it off and put on a dry strap. °· If you use tape to hold the bag on your leg: °1. Use an alcohol wipe or soap and water to wash your skin where the tape made it sticky before. °2. Use a clean towel to pat-dry that skin. °3. Use new tape to make the bag stay on your leg. °Wearing your bags °You should have been given a large overnight bag. °· You may wear the overnight bag in the day or night. °· Always have the overnight bag lower than your bladder.  Do not let the bag touch the floor. °· Before you go to sleep, put a clean plastic bag in a wastebasket. Then hang the overnight bag inside the wastebasket. °You should also have a smaller leg bag that fits under your clothes. °· Always wear the leg bag below your knee. °· Do not wear your leg bag at night. °How to care for your skin and catheter °Supplies needed °· A clean washcloth. °· Water and mild soap. °· A clean towel. °Caring for your skin and catheter ° °  ° °· Clean the skin around your catheter every day: °? Wash your hands with soap and water. °? Wet a clean washcloth in warm water and mild soap. °? Clean the skin around your urethra. °? If you are male: °? Gently  spread the folds of skin around your vagina (labia). °? With the washcloth in your other hand, wipe the inner side of your labia on each side. Wipe from front to back. °? If you are male: °? Pull back any skin that covers the end of your penis (foreskin). °? With the washcloth in your other hand, wipe your penis in small circles. Start wiping at the tip of your penis, then move away from the catheter. °? Move the foreskin back in place, if needed. °? With your free hand, hold the catheter close to where it goes into your body. °? Keep holding the catheter during cleaning so it does not get pulled out. °? With the washcloth in your other hand, clean the catheter. °? Only wipe downward on the catheter. °? Do not wipe upward toward your body. Doing this may push germs into your urethra and cause infection. °? Use a clean towel to pat-dry the catheter and the skin around it. Make sure to wipe off all soap. °? Wash your hands with soap and water. °· Shower every day. Do not take baths. °· Do not use cream, ointment, or lotion on the area where the catheter goes into your body, unless your doctor tells you   to. °· Do not use powders, sprays, or lotions on your genital area. °· Check your skin around the catheter every day for signs of infection. Check for: °? Redness, swelling, or pain. °? Fluid or blood. °? Warmth. °? Pus or a bad smell. °How to empty the bag °Supplies needed °· Rubbing alcohol. °· Gauze pad or cotton ball. °· Tape or a leg strap. °Emptying the bag °Pour the pee out of your bag when it is ?-½ full, or at least 2-3 times a day. Do this for your overnight bag and your leg bag. °1. Wash your hands with soap and water. °2. Separate (detach) the bag from your leg. °3. Hold the bag over the toilet or a clean pail. Keep the bag lower than your hips and bladder. This is so the pee (urine) does not go back into the tube. °4. Open the pour spout. It is at the bottom of the bag. °5. Empty the pee into the toilet or  pail. Do not let the pour spout touch any surface. °6. Put rubbing alcohol on a gauze pad or cotton ball. °7. Use the gauze pad or cotton ball to clean the pour spout. °8. Close the pour spout. °9. Attach the bag to your leg with tape or a leg strap. °10. Wash your hands with soap and water. °Follow instructions for cleaning the drainage bag: °· From the product maker. °· As told by your doctor. °How to change the bag °Supplies needed °· Alcohol wipes. °· A clean bag. °· Tape or a leg strap. °Changing the bag °Replace your bag when it starts to leak, smell bad, or look dirty. °1. Wash your hands with soap and water. °2. Separate the dirty bag from your leg. °3. Pinch the catheter with your fingers so that pee does not spill out. °4. Separate the catheter tube from the bag tube where these tubes connect (at the connection valve). Do not let the tubes touch any surface. °5. Clean the end of the catheter tube with an alcohol wipe. Use a different alcohol wipe to clean the end of the bag tube. °6. Connect the catheter tube to the tube of the clean bag. °7. Attach the clean bag to your leg with tape or a leg strap. Do not make the bag tight on your leg. °8. Wash your hands with soap and water. °General rules ° °· Never pull on your catheter. Never try to take it out. Doing that can hurt you. °· Always wash your hands before and after you touch your catheter or bag. Use a mild, fragrance-free soap. If you do not have soap and water, use hand sanitizer. °· Always make sure there are no twists or bends (kinks) in the catheter tube. °· Always make sure there are no leaks in the catheter or bag. °· Drink enough fluid to keep your pee pale yellow. °· Do not take baths, swim, or use a hot tub. °· If you are male, wipe from front to back after you poop (have a bowel movement). °Contact a doctor if: °· Your pee is cloudy. °· Your pee smells worse than usual. °· Your catheter gets clogged. °· Your catheter leaks. °· Your bladder  feels full. °Get help right away if: °· You have redness, swelling, or pain where the catheter goes into your body. °· You have fluid, blood, pus, or a bad smell coming from the area where the catheter goes into your body. °· Your skin feels warm where   the catheter goes into your body. °· You have a fever. °· You have pain in your: °? Belly (abdomen). °? Legs. °? Lower back. °? Bladder. °· You see blood in the catheter. °· Your pee is pink or red. °· You feel sick to your stomach (nauseous). °· You throw up (vomit). °· You have chills. °· Your pee is not draining into the bag. °· Your catheter gets pulled out. °Summary °· An indwelling urinary catheter is a thin tube that is placed into the bladder to help drain pee (urine) out of the body. °· The catheter is placed into the part of the body that drains pee from the bladder (urethra). °· Taking good care of your catheter will keep it working properly and help prevent problems. °· Always wash your hands before and after touching your catheter or bag. °· Never pull on your catheter or try to take it out. °This information is not intended to replace advice given to you by your health care provider. Make sure you discuss any questions you have with your health care provider. °Document Released: 04/20/2012 Document Revised: 04/17/2018 Document Reviewed: 08/09/2016 °[employer] Patient Education © 2020 [employer] ° °

## 2018-10-28 DIAGNOSIS — N3020 Other chronic cystitis without hematuria: Secondary | ICD-10-CM | POA: Diagnosis not present

## 2018-10-29 DIAGNOSIS — N35012 Post-traumatic membranous urethral stricture: Secondary | ICD-10-CM | POA: Diagnosis not present

## 2018-10-29 DIAGNOSIS — D414 Neoplasm of uncertain behavior of bladder: Secondary | ICD-10-CM | POA: Diagnosis not present

## 2018-10-29 DIAGNOSIS — N3020 Other chronic cystitis without hematuria: Secondary | ICD-10-CM | POA: Diagnosis not present

## 2018-10-29 DIAGNOSIS — N319 Neuromuscular dysfunction of bladder, unspecified: Secondary | ICD-10-CM | POA: Diagnosis not present

## 2018-11-03 DIAGNOSIS — K219 Gastro-esophageal reflux disease without esophagitis: Secondary | ICD-10-CM | POA: Diagnosis not present

## 2018-11-03 DIAGNOSIS — E871 Hypo-osmolality and hyponatremia: Secondary | ICD-10-CM | POA: Diagnosis not present

## 2018-11-03 DIAGNOSIS — I10 Essential (primary) hypertension: Secondary | ICD-10-CM | POA: Diagnosis not present

## 2018-11-03 DIAGNOSIS — E876 Hypokalemia: Secondary | ICD-10-CM | POA: Diagnosis not present

## 2018-11-24 DIAGNOSIS — Z125 Encounter for screening for malignant neoplasm of prostate: Secondary | ICD-10-CM | POA: Diagnosis not present

## 2018-11-24 DIAGNOSIS — I10 Essential (primary) hypertension: Secondary | ICD-10-CM | POA: Diagnosis not present

## 2018-11-24 DIAGNOSIS — E876 Hypokalemia: Secondary | ICD-10-CM | POA: Diagnosis not present

## 2018-11-24 DIAGNOSIS — E7800 Pure hypercholesterolemia, unspecified: Secondary | ICD-10-CM | POA: Diagnosis not present

## 2018-11-27 DIAGNOSIS — Q059 Spina bifida, unspecified: Secondary | ICD-10-CM | POA: Diagnosis not present

## 2018-11-27 DIAGNOSIS — Z9359 Other cystostomy status: Secondary | ICD-10-CM | POA: Diagnosis not present

## 2018-11-27 DIAGNOSIS — K219 Gastro-esophageal reflux disease without esophagitis: Secondary | ICD-10-CM | POA: Diagnosis not present

## 2018-11-27 DIAGNOSIS — Z125 Encounter for screening for malignant neoplasm of prostate: Secondary | ICD-10-CM | POA: Diagnosis not present

## 2018-11-27 DIAGNOSIS — N319 Neuromuscular dysfunction of bladder, unspecified: Secondary | ICD-10-CM | POA: Diagnosis not present

## 2018-11-27 DIAGNOSIS — I739 Peripheral vascular disease, unspecified: Secondary | ICD-10-CM | POA: Diagnosis not present

## 2018-11-27 DIAGNOSIS — I10 Essential (primary) hypertension: Secondary | ICD-10-CM | POA: Diagnosis not present

## 2018-11-27 DIAGNOSIS — E039 Hypothyroidism, unspecified: Secondary | ICD-10-CM | POA: Diagnosis not present

## 2018-11-27 DIAGNOSIS — E7800 Pure hypercholesterolemia, unspecified: Secondary | ICD-10-CM | POA: Diagnosis not present

## 2018-11-27 DIAGNOSIS — Z0000 Encounter for general adult medical examination without abnormal findings: Secondary | ICD-10-CM | POA: Diagnosis not present

## 2018-11-27 DIAGNOSIS — N390 Urinary tract infection, site not specified: Secondary | ICD-10-CM | POA: Diagnosis not present

## 2018-11-27 DIAGNOSIS — H5010 Unspecified exotropia: Secondary | ICD-10-CM | POA: Diagnosis not present

## 2018-11-27 DIAGNOSIS — N281 Cyst of kidney, acquired: Secondary | ICD-10-CM | POA: Diagnosis not present

## 2019-01-12 NOTE — Progress Notes (Signed)
PATIENT: Kenneth Arroyo DOB: [birth year]  REASON FOR VISIT: follow up HISTORY FROM: patient  HISTORY OF PRESENT ILLNESS: Today 01/13/19  Kenneth Arroyo is a 59 year old male with history of spina bifida with a VP shunt.  He is wheelchair-bound, he has never ambulated.  He has a neurogenic bowel and bladder.  He operates a Teacher, music without difficulty.  He has reported a history of memory loss.He has reported trouble sleeping, initially after being the caregiver for his demented father, who passed away last year. He reports his sleeping has improved.  He continues to go to bed early, and wake up early.  He continues to work on settling his father's estate.  He has not established his own housing, he continues to live in his father's home, he is not sure he will be able to continue living there as he will have to settle things with his brother and sister.  He manages his own affairs, and finances.  In regards to his memory, he reports he notices that he will finish things halfway, describes a situation where he went to the gas station, and did not put the gas cap back on, or flip the cover.  There is also the time when he had to take his wheelchair apart, he could not get the wheels back on, until he realized that the brake was on.  He does report he does not have much of an appetite, he is afraid to do much cooking, is afraid he will forget and leave the burner on.  He continues to do self catheterizations, he was hospitalized back in October for a bladder stricture.  He is nonambulatory.  He presents today for evaluation unaccompanied.  HISTORY 07/07/2018 SS: Kenneth Arroyo is a 59 year old male with history of spina bifida with a VP shunt.  At last visit, his B12 level was found to be low normal range, was started on oral supplementation.  He had a CT scan of the brain on November 2019 showing biventricular shunt catheters, with dilation of the lateral ventricles.  His MMSE in October 2019 was 29/30.  He is  wheelchair-bound, he has never ambulated.  He has a neurogenic bowel and bladder.  He has a very strong family history of memory disorder. He is able to operate a motor vehicle without difficulty.  He has been the primary caregiver for his father who suffered from advanced dementia.  His father recently passed away.  He continues to report insomnia, he will wake up at 2 or 3 in the morning, is not able to go back to sleep, will toss and turn.  He reports continued difficulty with his memory.  He will find that he is forgetful.  For this appointment, he came on Friday, had forgotten the office was closed.  He is able to manage his medications and finances.  He is currently working with social work to try to move to more affordable housing.  He is currently being evaluated by urology for 2 kinds of bacteria that was found in his urine.  He is planning to have cystoscopy to evaluate hematuria in the next few weeks.  He does have neurogenic bladder, he will self cath every 4-6 hours.  He reports he only has sensation when urine is leaking.  He is doing home physical therapy for right shoulder pain.  He says his primary care doctor recently tried to start medication for depression.  After talking with the pharmacy, he decided not to take  the medication.   REVIEW OF SYSTEMS: Out of a complete 14 system review of symptoms, the patient complains only of the following symptoms, and all other reviewed systems are negative.  Memory loss  ALLERGIES: Allergies  Allergen Reactions  . Amlodipine Swelling  . Amoxicillin Rash    Did it involve swelling of the face/tongue/throat, SOB, or low BP? No Did it involve sudden or severe rash/hives, skin peeling, or any reaction on the inside of your mouth or nose? No Did you need to seek medical attention at a hospital or doctor's office? Yes When did it last happen?20-30 years ago If all above answers are "NO", may proceed with cephalosporin use.   Marland Kitchen Doxycycline Rash   . Sulfa Antibiotics Rash  . Cefuroxime Axetil   . Coricidin D Cold-Flu-Sinus [Chlorphen-Pe-Acetaminophen] Diarrhea  . Famotidine Itching  . Fish Oil Diarrhea  . Folic Acid Itching    HOME MEDICATIONS: Outpatient Medications Prior to Visit  Medication Sig Dispense Refill  . acetaminophen (TYLENOL) 500 MG tablet Take 1,000 mg by mouth every 6 (six) hours as needed for moderate pain.    Marland Kitchen acyclovir (ZOVIRAX) 400 MG tablet Take 400 mg by mouth daily as needed (herpes and shingles).     Marland Kitchen atenolol (TENORMIN) 50 MG tablet Take 50 mg by mouth daily.     . cetirizine (ZYRTEC) 10 MG tablet Take 10 mg by mouth daily.    . ciprofloxacin (CIPRO) 500 MG tablet Take 1 tablet (500 mg total) by mouth 2 (two) times daily. 14 tablet 0  . levofloxacin (LEVAQUIN) 500 MG tablet Take 500 mg by mouth daily. "Take 1 tablet by mouth every day. Begin taking 2 days before his procedure"    . levothyroxine (SYNTHROID, LEVOTHROID) 75 MCG tablet Take 75 mcg by mouth daily before breakfast.   5  . olmesartan-hydrochlorothiazide (BENICAR HCT) 40-25 MG tablet Take 1 tablet by mouth daily.    Marland Kitchen omeprazole (PRILOSEC) 20 MG capsule Take 20 mg by mouth daily.     . vitamin B-12 (CYANOCOBALAMIN) 1000 MCG tablet Take 1,000 mcg by mouth daily.      No facility-administered medications prior to visit.    PAST MEDICAL HISTORY: Past Medical History:  Diagnosis Date  . Arterial occlusive disease   . Cold feet Nov 2013   Bilateral cold feet , discolored.  . Cutaneous skin tags   . Cyanosis   . Esophageal reflux   . Gall bladder polyp   . History of fractured kneecap    after fall from wheelchair while playing sports; no surgery , placed in cast   . History of paraplegia    Secondary to spina bifida   . Hypertension   . Memory difficulty 11/05/2017  . Neurogenic bladder, NOS   . Self-catheterizes urinary bladder   . Spina bifida without mention of hydrocephalus, unspecified region   . UTI (lower urinary tract  infection)     PAST SURGICAL HISTORY: Past Surgical History:  Procedure Laterality Date  . bone spur surgery  Right    right shoulder   . COLONOSCOPY  2001  . CSF SHUNT  infancy  . CYSTOSCOPY WITH URETHRAL DILATATION N/A 10/15/2018   Procedure: CYSTOSCOPY WITH URETHRAL balloon DILATATION;  Surgeon: Irine Seal, MD;  Location: [facility];  Service: Urology;  Laterality: N/A;  . TRANSURETHRAL RESECTION OF BLADDER TUMOR N/A 10/15/2018   Procedure: TRANSURETHRAL RESECTION OF BLADDER LESION(TURBT);  Surgeon: Irine Seal, MD;  Location: [facility];  Service: Urology;  Laterality: N/A;  .  UPPER GASTROINTESTINAL ENDOSCOPY      FAMILY HISTORY: Family History  Problem Relation Age of Onset  . Alzheimer's disease Mother   . Hyperlipidemia Mother   . Dementia Mother   . Cataracts Father   . Hypertension Father   . Kidney failure Father   . Atrial fibrillation Father   . Other Sister        varicose veins    SOCIAL HISTORY: Social History   Socioeconomic History  . Marital status: Single    Spouse name: Not on file  . Number of children: Not on file  . Years of education: Not on file  . Highest education level: Not on file  Occupational History  . Not on file  Tobacco Use  . Smoking status: Never Smoker  . Smokeless tobacco: Never Used  Substance and Sexual Activity  . Alcohol use: No  . Drug use: No  . Sexual activity: Not on file  Other Topics Concern  . Not on file  Social History Narrative  . Not on file   Social Determinants of Health   Financial Resource Strain:   . Difficulty of Paying Living Expenses: Not on file  Food Insecurity:   . Worried About Charity fundraiser in the Last Year: Not on file  . Ran Out of Food in the Last Year: Not on file  Transportation Needs:   . Lack of Transportation (Medical): Not on file  . Lack of Transportation (Non-Medical): Not on file  Physical Activity:   . Days of Exercise per Week: Not on file  . Minutes of Exercise per Session:  Not on file  Stress:   . Feeling of Stress : Not on file  Social Connections:   . Frequency of Communication with Friends and Family: Not on file  . Frequency of Social Gatherings with Friends and Family: Not on file  . Attends Religious Services: Not on file  . Active Member of Clubs or Organizations: Not on file  . Attends Archivist Meetings: Not on file  . Marital Status: Not on file  Intimate Partner Violence:   . Fear of Current or Ex-Partner: Not on file  . Emotionally Abused: Not on file  . Physically Abused: Not on file  . Sexually Abused: Not on file   PHYSICAL EXAM  Vitals:   01/13/19 0947  BP: 132/84  Pulse: (!) 56  Temp: (!) 90 F (32.2 C)   There is no height or weight on file to calculate BMI.  Generalized: Well developed, in no acute distress  MMSE - Mini Mental State Exam 01/13/2019 07/07/2018 11/05/2017  Orientation to time 5 5 5   Orientation to Place 4 5 5   Registration 3 3 3   Attention/ Calculation 3 5 5   Recall 2 1 2   Language- name 2 objects 2 2 2   Language- repeat 1 1 1   Language- follow 3 step command 2 3 3   Language- read & follow direction 1 1 1   Write a sentence 1 1 1   Copy design 1 1 1   Copy design-comments named 7 animals - -  Total score 25 28 29     Neurological examination  Mentation: Alert oriented to time, place, history taking. Follows all commands speech and language fluent Cranial nerve II-XII: Pupils were equal round reactive to light. Extraocular movements were full, visual field were full on confrontational test. Facial sensation and strength were normal.  Head turning and shoulder shrug  were normal and symmetric. Motor: Good strength  of upper extremities, no movement of lower extremities Sensory: Sensory testing is intact to soft touch on his upper extremities, he has no sensation to the legs Coordination: Cerebellar testing reveals good finger-nose-finger bilaterally Gait and station: He is nonambulatory Reflexes:  Deep tendon reflexes are symmetric and normal in the upper extremities  DIAGNOSTIC DATA (LABS, IMAGING, TESTING) - I reviewed patient records, labs, notes, testing and imaging myself where available.  Lab Results  Component Value Date   WBC 8.2 10/12/2018   HGB 13.3 10/12/2018   HCT 39.5 10/12/2018   MCV 91.0 10/12/2018   PLT 205 10/12/2018      Component Value Date/Time   NA 140 10/12/2018 1505   K 3.6 10/12/2018 1505   CL 101 10/12/2018 1505   CO2 30 10/12/2018 1505   GLUCOSE 94 10/12/2018 1505   BUN 12 10/12/2018 1505   CREATININE 0.80 10/12/2018 1505   CALCIUM 9.3 10/12/2018 1505   PROT 7.4 10/04/2018 1717   ALBUMIN 4.0 10/04/2018 1717   AST 19 10/04/2018 1717   ALT 20 10/04/2018 1717   ALKPHOS 53 10/04/2018 1717   BILITOT 1.0 10/04/2018 1717   GFRNONAA >60 10/12/2018 1505   GFRAA >60 10/12/2018 1505   No results found for: CHOL, HDL, LDLCALC, LDLDIRECT, TRIG, CHOLHDL No results found for: HGBA1C Lab Results  Component Value Date   VITAMINB12 237 11/05/2017   No results found for: TSH  ASSESSMENT AND PLAN 59 y.o. year old male  has a past medical history of Arterial occlusive disease, Cold feet (Nov 2013), Cutaneous skin tags, Cyanosis, Esophageal reflux, Gall bladder polyp, History of fractured kneecap, History of paraplegia, Hypertension, Memory difficulty (11/05/2017), Neurogenic bladder, NOS, Self-catheterizes urinary bladder, Spina bifida without mention of hydrocephalus, unspecified region, and UTI (lower urinary tract infection). here with:  1. History of spina bifida, paraplegia  2. Neurogenic bladder and bowel  3. Reported memory disturbance   His memory score continues to remain stable 25/30.  We discussed initiating Aricept or Namenda, but he would like to discuss with his urologist and primary doctor first (I think he may try Aricept, as he does do urinary self catheterizations).  He will let me know if he would like to try one of the medications.  He  says he is sleeping better.  He continues to work to settle his father's estate, and is unsure about his future housing.  He will follow-up in 8 months or sooner if needed.  I did advise if his symptoms worsen or if he develops any new symptoms he should let us know.  CT scan of the brain 11/19/2017 IMPRESSION: 1. Chiari 2 malformation. 2. Ventriculomegaly with biparietal shunt catheters. The degree of ventricular dilatation is congruent with a degree of white matter volume loss. 3. Discontinuous right-sided shunt catheter. 4. No suspected reversible finding.  I spent 15 minutes with the patient. 50% of this time was spent discussing his plan of care.    Butler Denmark, AGNP-C, DNP 01/13/2019, 9:50 AM [facility] [address] [place], [place] [address] [phone]

## 2019-01-13 ENCOUNTER — Ambulatory Visit (INDEPENDENT_AMBULATORY_CARE_PROVIDER_SITE_OTHER): Payer: Medicare Other | Admitting: Neurology

## 2019-01-13 ENCOUNTER — Encounter: Payer: Self-pay | Admitting: Neurology

## 2019-01-13 ENCOUNTER — Other Ambulatory Visit: Payer: Self-pay

## 2019-01-13 VITALS — BP 132/84 | HR 56 | Temp 90.0°F

## 2019-01-13 DIAGNOSIS — R413 Other amnesia: Secondary | ICD-10-CM | POA: Diagnosis not present

## 2019-01-13 NOTE — Patient Instructions (Signed)
Your memory score today was 25/30, let's consider Aricept or Namenda for your memory. Please let me know what you decide. I will see you back in 8 months or sooner if needed.

## 2019-01-16 NOTE — Progress Notes (Signed)
I have read the note, and I agree with the clinical assessment and plan.  Jerri Glauser K Forever Arechiga   

## 2019-02-01 ENCOUNTER — Telehealth: Payer: Self-pay | Admitting: Neurology

## 2019-02-01 NOTE — Telephone Encounter (Signed)
Prevagen is available OTC, not regulated, I think Aricept or Namenda would be better, but if he wants to try to [place] first that is ok, just ensure no interaction with other medications.

## 2019-02-01 NOTE — Telephone Encounter (Signed)
Patient is calling in wanting to know if Prevagen is ok to take instead of Aricept or Namenda

## 2019-02-02 NOTE — Telephone Encounter (Signed)
I called pt and relayed to him that he is ok to use prevagen.  Check with pharmacy to make sure not interactions with other meds. He will think about it and let us know.

## 2019-02-10 MED ORDER — DONEPEZIL HCL 5 MG PO TABS: 5.0000 mg | ORAL_TABLET | Freq: Every day | ORAL | 0 refills | Status: DC

## 2019-02-10 NOTE — Addendum Note (Signed)
Addended by: Suzzanne Cloud on: 02/10/2019 02:52 PM   Modules accepted: Orders

## 2019-02-10 NOTE — Telephone Encounter (Signed)
I'll send in Aricept 5 mg low dose at bedtime.

## 2019-02-10 NOTE — Telephone Encounter (Signed)
Patient called stating he spoke with his insurance as well about trying the aricept. Patient would like a month supply to start and see how he reacts towards it.   Please follow up.

## 2019-02-10 NOTE — Telephone Encounter (Signed)
I spoke to to pt and relayed that the donepezil is ordered for him 5mg  po qhs, low dose.  Watch for SE and call us as needed.  Only 30 day given, call us toward end of this and will fill again. He verbalized understanding.

## 2019-09-14 ENCOUNTER — Ambulatory Visit: Payer: Medicare Other | Admitting: Neurology

## 2019-11-23 DIAGNOSIS — E039 Hypothyroidism, unspecified: Secondary | ICD-10-CM | POA: Diagnosis not present

## 2019-11-23 DIAGNOSIS — N390 Urinary tract infection, site not specified: Secondary | ICD-10-CM | POA: Diagnosis not present

## 2019-11-23 DIAGNOSIS — I10 Essential (primary) hypertension: Secondary | ICD-10-CM | POA: Diagnosis not present

## 2019-11-23 DIAGNOSIS — E7800 Pure hypercholesterolemia, unspecified: Secondary | ICD-10-CM | POA: Diagnosis not present

## 2019-11-23 DIAGNOSIS — Z125 Encounter for screening for malignant neoplasm of prostate: Secondary | ICD-10-CM | POA: Diagnosis not present

## 2019-11-30 DIAGNOSIS — L570 Actinic keratosis: Secondary | ICD-10-CM | POA: Diagnosis not present

## 2019-11-30 DIAGNOSIS — F411 Generalized anxiety disorder: Secondary | ICD-10-CM | POA: Diagnosis not present

## 2019-11-30 DIAGNOSIS — I739 Peripheral vascular disease, unspecified: Secondary | ICD-10-CM | POA: Diagnosis not present

## 2019-11-30 DIAGNOSIS — Z0000 Encounter for general adult medical examination without abnormal findings: Secondary | ICD-10-CM | POA: Diagnosis not present

## 2019-11-30 DIAGNOSIS — N390 Urinary tract infection, site not specified: Secondary | ICD-10-CM | POA: Diagnosis not present

## 2019-11-30 DIAGNOSIS — K219 Gastro-esophageal reflux disease without esophagitis: Secondary | ICD-10-CM | POA: Diagnosis not present

## 2019-11-30 DIAGNOSIS — Q059 Spina bifida, unspecified: Secondary | ICD-10-CM | POA: Diagnosis not present

## 2019-11-30 DIAGNOSIS — R413 Other amnesia: Secondary | ICD-10-CM | POA: Diagnosis not present

## 2019-11-30 DIAGNOSIS — E7800 Pure hypercholesterolemia, unspecified: Secondary | ICD-10-CM | POA: Diagnosis not present

## 2019-11-30 DIAGNOSIS — I10 Essential (primary) hypertension: Secondary | ICD-10-CM | POA: Diagnosis not present

## 2019-11-30 DIAGNOSIS — E039 Hypothyroidism, unspecified: Secondary | ICD-10-CM | POA: Diagnosis not present

## 2019-11-30 DIAGNOSIS — N319 Neuromuscular dysfunction of bladder, unspecified: Secondary | ICD-10-CM | POA: Diagnosis not present

## 2019-12-15 DIAGNOSIS — F411 Generalized anxiety disorder: Secondary | ICD-10-CM | POA: Diagnosis not present

## 2019-12-15 DIAGNOSIS — E039 Hypothyroidism, unspecified: Secondary | ICD-10-CM | POA: Diagnosis not present

## 2019-12-15 DIAGNOSIS — F321 Major depressive disorder, single episode, moderate: Secondary | ICD-10-CM | POA: Diagnosis not present

## 2019-12-15 DIAGNOSIS — E7800 Pure hypercholesterolemia, unspecified: Secondary | ICD-10-CM | POA: Diagnosis not present

## 2019-12-15 DIAGNOSIS — R42 Dizziness and giddiness: Secondary | ICD-10-CM | POA: Diagnosis not present

## 2019-12-15 DIAGNOSIS — I10 Essential (primary) hypertension: Secondary | ICD-10-CM | POA: Diagnosis not present

## 2019-12-15 DIAGNOSIS — E871 Hypo-osmolality and hyponatremia: Secondary | ICD-10-CM | POA: Diagnosis not present

## 2020-01-04 DIAGNOSIS — F321 Major depressive disorder, single episode, moderate: Secondary | ICD-10-CM | POA: Diagnosis not present

## 2020-01-04 DIAGNOSIS — I739 Peripheral vascular disease, unspecified: Secondary | ICD-10-CM | POA: Diagnosis not present

## 2020-01-04 DIAGNOSIS — E871 Hypo-osmolality and hyponatremia: Secondary | ICD-10-CM | POA: Diagnosis not present

## 2020-04-04 DIAGNOSIS — M25511 Pain in right shoulder: Secondary | ICD-10-CM | POA: Diagnosis not present

## 2020-04-04 DIAGNOSIS — I10 Essential (primary) hypertension: Secondary | ICD-10-CM | POA: Diagnosis not present

## 2020-06-19 ENCOUNTER — Other Ambulatory Visit: Payer: Self-pay

## 2020-06-19 DIAGNOSIS — I509 Heart failure, unspecified: Secondary | ICD-10-CM | POA: Diagnosis not present

## 2020-06-19 DIAGNOSIS — I517 Cardiomegaly: Secondary | ICD-10-CM | POA: Diagnosis not present

## 2020-06-19 DIAGNOSIS — R079 Chest pain, unspecified: Secondary | ICD-10-CM | POA: Diagnosis not present

## 2020-06-19 DIAGNOSIS — I10 Essential (primary) hypertension: Secondary | ICD-10-CM | POA: Insufficient documentation

## 2020-06-19 DIAGNOSIS — J9811 Atelectasis: Secondary | ICD-10-CM | POA: Diagnosis not present

## 2020-06-19 DIAGNOSIS — Z79899 Other long term (current) drug therapy: Secondary | ICD-10-CM | POA: Diagnosis not present

## 2020-06-19 DIAGNOSIS — R0789 Other chest pain: Secondary | ICD-10-CM | POA: Diagnosis not present

## 2020-06-19 DIAGNOSIS — J811 Chronic pulmonary edema: Secondary | ICD-10-CM | POA: Diagnosis not present

## 2020-06-19 DIAGNOSIS — Z20822 Contact with and (suspected) exposure to covid-19: Secondary | ICD-10-CM | POA: Diagnosis not present

## 2020-06-19 DIAGNOSIS — R072 Precordial pain: Secondary | ICD-10-CM | POA: Diagnosis not present

## 2020-06-20 ENCOUNTER — Emergency Department (HOSPITAL_COMMUNITY)
Admission: EM | Admit: 2020-06-20 | Discharge: 2020-06-20 | Disposition: A | Discharge status: Home / Self Care | Payer: Medicare Other | Attending: Emergency Medicine | Admitting: Emergency Medicine

## 2020-06-20 ENCOUNTER — Emergency Department (HOSPITAL_BASED_OUTPATIENT_CLINIC_OR_DEPARTMENT_OTHER): Payer: Medicare Other

## 2020-06-20 ENCOUNTER — Emergency Department (HOSPITAL_COMMUNITY): Payer: Medicare Other

## 2020-06-20 DIAGNOSIS — I517 Cardiomegaly: Secondary | ICD-10-CM

## 2020-06-20 DIAGNOSIS — I509 Heart failure, unspecified: Secondary | ICD-10-CM | POA: Diagnosis not present

## 2020-06-20 DIAGNOSIS — R079 Chest pain, unspecified: Secondary | ICD-10-CM | POA: Diagnosis not present

## 2020-06-20 DIAGNOSIS — J9811 Atelectasis: Secondary | ICD-10-CM | POA: Diagnosis not present

## 2020-06-20 DIAGNOSIS — R072 Precordial pain: Secondary | ICD-10-CM

## 2020-06-20 DIAGNOSIS — J811 Chronic pulmonary edema: Secondary | ICD-10-CM | POA: Diagnosis not present

## 2020-06-20 LAB — ECHOCARDIOGRAM COMPLETE
AR max vel: 2.04 cm2
AV Area VTI: 2.03 cm2
AV Area mean vel: 1.97 cm2
AV Mean grad: 3 mmHg
AV Peak grad: 5.9 mmHg
Ao pk vel: 1.21 m/s
Area-P 1/2: 3.5 cm2
Calc EF: 66.1 %
Height: 60 in
S' Lateral: 1.6 cm
Single Plane A2C EF: 55.3 %
Single Plane A4C EF: 75.2 %

## 2020-06-20 LAB — CBC WITH DIFFERENTIAL/PLATELET
Abs Immature Granulocytes: 0.03 10*3/uL (ref 0.00–0.07)
Basophils Absolute: 0 10*3/uL (ref 0.0–0.1)
Basophils Relative: 1 %
Eosinophils Absolute: 0.1 10*3/uL (ref 0.0–0.5)
Eosinophils Relative: 1 %
HCT: 41.1 % (ref 39.0–52.0)
Hemoglobin: 14 g/dL (ref 13.0–17.0)
Immature Granulocytes: 0 %
Lymphocytes Relative: 11 %
Lymphs Abs: 0.9 10*3/uL (ref 0.7–4.0)
MCH: 30.8 pg (ref 26.0–34.0)
MCHC: 34.1 g/dL (ref 30.0–36.0)
MCV: 90.5 fL (ref 80.0–100.0)
Monocytes Absolute: 0.4 10*3/uL (ref 0.1–1.0)
Monocytes Relative: 5 %
Neutro Abs: 6.3 10*3/uL (ref 1.7–7.7)
Neutrophils Relative %: 82 %
Platelets: 134 10*3/uL — ABNORMAL LOW (ref 150–400)
RBC: 4.54 MIL/uL (ref 4.22–5.81)
RDW: 13.5 % (ref 11.5–15.5)
WBC: 7.7 10*3/uL (ref 4.0–10.5)
nRBC: 0 % (ref 0.0–0.2)

## 2020-06-20 LAB — COMPREHENSIVE METABOLIC PANEL
ALT: 34 U/L (ref 0–44)
AST: 24 U/L (ref 15–41)
Albumin: 4.1 g/dL (ref 3.5–5.0)
Alkaline Phosphatase: 47 U/L (ref 38–126)
Anion gap: 9 (ref 5–15)
BUN: 18 mg/dL (ref 6–20)
CO2: 28 mmol/L (ref 22–32)
Calcium: 9.1 mg/dL (ref 8.9–10.3)
Chloride: 100 mmol/L (ref 98–111)
Creatinine, Ser: 0.86 mg/dL (ref 0.61–1.24)
GFR, Estimated: >60 mL/min (ref >60)
Glucose, Bld: 115 mg/dL — ABNORMAL HIGH (ref 70–99)
Potassium: 3.4 mmol/L — ABNORMAL LOW (ref 3.5–5.1)
Sodium: 137 mmol/L (ref 135–145)
Total Bilirubin: 0.6 mg/dL (ref 0.3–1.2)
Total Protein: 8 g/dL (ref 6.5–8.1)

## 2020-06-20 LAB — TROPONIN I (HIGH SENSITIVITY)
Troponin I (High Sensitivity): 5 ng/L (ref <18)
Troponin I (High Sensitivity): 5 ng/L (ref <18)

## 2020-06-20 LAB — BRAIN NATRIURETIC PEPTIDE: B Natriuretic Peptide: 30 pg/mL (ref 0.0–100.0)

## 2020-06-20 LAB — SARS CORONAVIRUS 2 (TAT 6-24 HRS): SARS Coronavirus 2: NEGATIVE

## 2020-06-20 LAB — LIPASE, BLOOD: Lipase: 34 U/L (ref 11–51)

## 2020-06-20 MED ORDER — OLMESARTAN MEDOXOMIL-HCTZ 40-25 MG PO TABS: 1.0000 | ORAL_TABLET | Freq: Every day | ORAL | Status: DC

## 2020-06-20 MED ORDER — ATENOLOL 50 MG PO TABS
50.0000 mg | ORAL_TABLET | Freq: Every day | ORAL | Status: DC
  Filled 2020-06-20: qty 1

## 2020-06-20 MED ORDER — HYDROCHLOROTHIAZIDE 25 MG PO TABS
25.0000 mg | ORAL_TABLET | Freq: Every day | ORAL | Status: DC
  Filled 2020-06-20: qty 1

## 2020-06-20 MED ORDER — LIDOCAINE VISCOUS HCL 2 % MT SOLN
15.0000 mL | Freq: Once | OROMUCOSAL | Status: AC
  Administered 2020-06-20: 15 mL via ORAL
  Filled 2020-06-20: qty 15

## 2020-06-20 MED ORDER — IRBESARTAN 300 MG PO TABS
300.0000 mg | ORAL_TABLET | Freq: Every day | ORAL | Status: DC
  Filled 2020-06-20: qty 1

## 2020-06-20 MED ORDER — HYDRALAZINE HCL 20 MG/ML IJ SOLN
10.0000 mg | Freq: Once | INTRAMUSCULAR | Status: AC
  Administered 2020-06-20: 10 mg via INTRAVENOUS
  Filled 2020-06-20: qty 1

## 2020-06-20 MED ORDER — ALUM & MAG HYDROXIDE-SIMETH 200-200-20 MG/5ML PO SUSP
30.0000 mL | Freq: Once | ORAL | Status: AC
  Administered 2020-06-20: 30 mL via ORAL
  Filled 2020-06-20: qty 30

## 2020-06-20 NOTE — Discharge Instructions (Addendum)
You were seen in the ER for chest pain. Your heart enzymes are normal, EKG is reassuring.  Chest x-ray did show some congestion of the heart, we ordered echocardiogram which is reassuring.  We still want you to follow-up with your primary care doctor and Cardiologist. Expect the cardiologist to call you with an appointment.

## 2020-06-20 NOTE — ED Provider Notes (Signed)
Emergency Medicine Provider Triage Evaluation Note  Kenneth Arroyo , a 60 y.o. male  was evaluated in triage.  Pt complains of intermittent chest pain onset around 8 PM.  Patient reports the episodes are located in his left chest, are described as sharp and severe and last approximately 30 seconds.  Patient reports they occurred approximately every 30 minutes for almost 2 hours and have not recurred since that time.  Denies a history of cardiac problems but does have a history of PVD.  Denies associated nausea, vomiting, diaphoresis, syncope or near syncope.  Review of Systems  Positive: Chest pain Negative: Fever, chills, nausea, vomiting, diaphoresis  Physical Exam  BP (!) 161/102   Pulse 62   Temp 98.7 F (37.1 C) (Oral)   Resp 14   Ht 5' (1.524 m)   SpO2 100%   BMI 39.35 kg/m  Gen:   Awake, no distress   Resp:  Normal effort, clear and equal breath sounds MSK:   Moves extremities without difficulty  Other:  Wheelchair-bound  Medical Decision Making  Medically screening exam initiated at 4:56 AM.  Appropriate orders placed.  Georgina Peer was informed that the remainder of the evaluation will be completed by another provider, this initial triage assessment does not replace that evaluation, and the importance of remaining in the ED until their evaluation is complete.  Intermittent chest pain.  Chest pain work-up initiated   Agapito Games 06/20/20 0500    Malvin Johns, MD 06/20/20 [phone]

## 2020-06-20 NOTE — ED Notes (Signed)
Pt placed on bedpan, pt unable to go.

## 2020-06-20 NOTE — Progress Notes (Signed)
*  PRELIMINARY RESULTS* Echocardiogram 2D Echocardiogram has been performed.  Luisa Hart 06/20/2020, 3:16 PM

## 2020-06-20 NOTE — ED Provider Notes (Signed)
[facility] DEPT Provider Note   CSN: [id number] Arrival date & time: 06/19/20  2337     History Chief Complaint  Patient presents with   Chest Pain    Kenneth Arroyo is a 60 y.o. male.  HPI  HPI: A 60 year old patient with a history of hypertension and obesity presents for evaluation of chest pain. Initial onset of pain was more than 6 hours ago. The patient's chest pain is sharp and is not worse with exertion. The patient's chest pain is middle- or left-sided, is not well-localized, is not described as heaviness/pressure/tightness and does not radiate to the arms/jaw/neck. The patient does not complain of nausea and denies diaphoresis. The patient has no history of stroke, has no history of peripheral artery disease, has not smoked in the past 90 days, denies any history of treated diabetes, has no relevant family history of coronary artery disease (first degree relative at less than age 79) and has no history of hypercholesterolemia.   Patient had episodic chest pain into 3 times yesterday, and 1 while he was coming to the ER.  While in the waiting room, the chest pain is resolved.  He thought the pain was heartburn related, but with time his suspicion is lower that it is that.  Chest pain was left-sided, nonradiating.  He denies any associated shortness of breath  Past Medical History:  Diagnosis Date   Arterial occlusive disease    Cold feet Nov 2013   Bilateral cold feet , discolored.   Cutaneous skin tags    Cyanosis    Esophageal reflux    Gall bladder polyp    History of fractured kneecap    after fall from wheelchair while playing sports; no surgery , placed in cast    History of paraplegia    Secondary to spina bifida    Hypertension    Memory difficulty 11/05/2017   Neurogenic bladder, NOS    Self-catheterizes urinary bladder    Spina bifida without mention of hydrocephalus, unspecified region    UTI (lower urinary tract infection)      Patient Active Problem List   Diagnosis Date Noted   Hypervascular lesion of urinary bladder 10/15/2018   Memory difficulty 11/05/2017   Peripheral vascular disease, unspecified ([place]) 12/18/2011    Past Surgical History:  Procedure Laterality Date   bone spur surgery  Right    right shoulder    COLONOSCOPY  2001   [facility]  infancy   CYSTOSCOPY WITH URETHRAL DILATATION N/A 10/15/2018   Procedure: CYSTOSCOPY WITH URETHRAL balloon DILATATION;  Surgeon: Irine Seal, MD;  Location: [facility];  Service: Urology;  Laterality: N/A;   TRANSURETHRAL RESECTION OF BLADDER TUMOR N/A 10/15/2018   Procedure: TRANSURETHRAL RESECTION OF BLADDER LESION(TURBT);  Surgeon: Irine Seal, MD;  Location: [facility];  Service: Urology;  Laterality: N/A;   UPPER GASTROINTESTINAL ENDOSCOPY         Family History  Problem Relation Age of Onset   Alzheimer's disease Mother    Hyperlipidemia Mother    Dementia Mother    Cataracts Father    Hypertension Father    Kidney failure Father    Atrial fibrillation Father    Other Sister        varicose veins    Social History   Tobacco Use   Smoking status: Never   Smokeless tobacco: Never  Substance Use Topics   Alcohol use: No   Drug use: No    Home Medications Prior to  Admission medications   Medication Sig Start Date End Date Taking? Authorizing Provider  atenolol (TENORMIN) 50 MG tablet Take 50 mg by mouth daily.    Yes [provider]  cetirizine (ZYRTEC) 10 MG tablet Take 10 mg by mouth daily.   Yes [provider]  diphenhydrAMINE (BENADRYL) 12.5 MG/5ML liquid Take 6.25 mg by mouth at bedtime as needed for sleep.   Yes [provider]  levothyroxine (SYNTHROID, LEVOTHROID) 75 MCG tablet Take 75 mcg by mouth daily before breakfast.  09/03/17  Yes [provider]  olmesartan-hydrochlorothiazide (BENICAR HCT) 40-25 MG tablet Take 1 tablet by mouth daily.   Yes [provider]  omeprazole (PRILOSEC) 20 MG  capsule Take 20 mg by mouth daily.    Yes [provider]  vitamin B-12 (CYANOCOBALAMIN) 1000 MCG tablet Take 1,000 mcg by mouth daily.    Yes [provider]    Allergies    Amlodipine, Amoxicillin, Doxycycline, Sulfa antibiotics, Cefuroxime axetil, Coricidin d cold-flu-sinus [chlorphen-pe-acetaminophen], Famotidine, Fish oil, and Folic acid  Review of Systems   Review of Systems  Constitutional:  Positive for activity change.  Respiratory:  Negative for shortness of breath.   Cardiovascular:  Positive for chest pain.  Gastrointestinal:  Negative for nausea and vomiting.  Neurological:  Negative for dizziness.  All other systems reviewed and are negative.  Physical Exam Updated Vital Signs BP 138/74 (BP Location: Left Arm)   Pulse 89   Temp 98 F (36.7 C) (Oral)   Resp 14   Ht 5' (1.524 m)   SpO2 95%   BMI 39.35 kg/m   Physical Exam Vitals and nursing note reviewed.  Constitutional:      Appearance: He is well-developed.  HENT:     Head: Atraumatic.  Cardiovascular:     Rate and Rhythm: Normal rate.  Pulmonary:     Effort: Pulmonary effort is normal.  Musculoskeletal:     Cervical back: Neck supple.  Skin:    General: Skin is warm.  Neurological:     Mental Status: He is alert and oriented to person, place, and time.    ED Results / Procedures / Treatments   Labs (all labs ordered are listed, but only abnormal results are displayed) Labs Reviewed  CBC WITH DIFFERENTIAL/PLATELET - Abnormal; Notable for the following components:      Result Value   Platelets 134 (*)    All other components within normal limits  COMPREHENSIVE METABOLIC PANEL - Abnormal; Notable for the following components:   Potassium 3.4 (*)    Glucose, Bld 115 (*)    All other components within normal limits  SARS CORONAVIRUS 2 (TAT 6-24 HRS)  LIPASE, BLOOD  BRAIN NATRIURETIC PEPTIDE  TROPONIN I (HIGH SENSITIVITY)  TROPONIN I (HIGH SENSITIVITY)    EKG EKG  Interpretation  Date/Time:  Tuesday June 20 2020 09:12:43 EDT Ventricular Rate:  65 PR Interval:  159 QRS Duration: 97 QT Interval:  417 QTC Calculation: 434 R Axis:   60 Text Interpretation: Sinus rhythm No acute changes No significant change since last tracing Confirmed by Varney Biles [phone]) on 06/20/2020 10:51:42 AM  Radiology DG Chest 2 View  Result Date: 06/20/2020 CLINICAL DATA:  Sudden onset left upper chest pain EXAM: CHEST - 2 VIEW COMPARISON:  Radiograph 10/03/2020 FINDINGS: Cardiac silhouette appears enlarged from comparison prior portable radiography. Diffuse hazy opacity throughout both lungs with pulmonary vascular congestion. Opacity likely accentuated by body habitus. No visible pleural effusion. No pneumothorax. Scoliotic curvature of the  spine with chest wall deformity. Degenerative changes are present in the imaged spine and shoulders. No acute osseous or soft tissue abnormality. IMPRESSION: Cardiac silhouette enlarged from comparison priors, could reflect cardiomegaly and/or pericardial effusion. Diffuse hazy opacities throughout both lungs favor pulmonary edema and atelectasis given pulmonary vascular congestion and low volumes. Constellation of features may reflect developing CHF. Electronically Signed   By: Lovena Le M.D.   On: 06/20/2020 04:42   ECHOCARDIOGRAM COMPLETE  Result Date: 06/20/2020    ECHOCARDIOGRAM REPORT   Patient Name:   Kenneth Arroyo [facility] Date of Exam: 06/20/2020 Medical Rec #:  [id number]    Height:       60.0 in Accession #:    [id number]   Weight:       201.5 lb Date of Birth:  [birth year]    BSA:          1.873 m Patient Age:    28 years     BP:           133/79 mmHg Patient Gender: M            HR:           77 bpm. Exam Location:  Inpatient Procedure: 2D Echo, Color Doppler and Cardiac Doppler Indications:    Cardiomegaly  History:        Patient has no prior history of Echocardiogram examinations.                 Risk Factors:Hypertension.  Sonographer:     Luisa Hart RDCS Referring Phys: 58 Rosalynn Sergent  Sonographer Comments: Technically difficult study due to poor echo windows and patient is morbidly obese. Image acquisition challenging due to patient body habitus and spina bifida. IMPRESSIONS  1. Extremely limited study; limited views available.  2. Left ventricular ejection fraction, by estimation, is 70 to 75%. The left ventricle has hyperdynamic function. The left ventricle has no regional wall motion abnormalities. Left ventricular diastolic parameters are consistent with Grade I diastolic dysfunction (impaired relaxation).  3. Right ventricular systolic function was not well visualized. The right ventricular size is not well visualized.  4. The mitral valve is grossly normal. No evidence of mitral valve regurgitation. No evidence of mitral stenosis.  5. Tricuspid valve regurgitation not assessed.  6. The aortic valve was not well visualized. Aortic valve regurgitation is not visualized. No aortic stenosis is present.  7. Pulmonic valve regurgitation not assessed. FINDINGS  Left Ventricle: Left ventricular ejection fraction, by estimation, is 70 to 75%. The left ventricle has hyperdynamic function. The left ventricle has no regional wall motion abnormalities. The left ventricular internal cavity size was normal in size. There is no left ventricular hypertrophy. Left ventricular diastolic parameters are consistent with Grade I diastolic dysfunction (impaired relaxation). Right Ventricle: The right ventricular size is not well visualized. Right vetricular wall thickness was not assessed. Right ventricular systolic function was not well visualized. Left Atrium: Left atrial size was normal in size. Right Atrium: Right atrial size was not well visualized. Pericardium: There is no evidence of pericardial effusion. Mitral Valve: The mitral valve is grossly normal. No evidence of mitral valve regurgitation. No evidence of mitral valve stenosis. Tricuspid Valve:  The tricuspid valve is not assessed. Tricuspid valve regurgitation not assessed. No evidence of tricuspid stenosis. Aortic Valve: The aortic valve was not well visualized. Aortic valve regurgitation is not visualized. No aortic stenosis is present. Aortic valve mean gradient measures 3.0 mmHg. Aortic valve peak gradient measures 5.9 mmHg.  Aortic valve area, by VTI measures 2.03 cm. Pulmonic Valve: The pulmonic valve was not well visualized. Pulmonic valve regurgitation not assessed. Aorta: The aortic root is normal in size and structure. Venous: The inferior vena cava was not well visualized.  Additional Comments: Extremely limited study; limited views available.  LEFT VENTRICLE PLAX 2D LVIDd:         3.30 cm     Diastology LVIDs:         1.60 cm     LV e' medial:    6.74 cm/s LV PW:         1.60 cm     LV E/e' medial:  8.9 LV IVS:        1.40 cm     LV e' lateral:   5.11 cm/s LVOT diam:     1.90 cm     LV E/e' lateral: 11.7 LV SV:         46 LV SV Index:   25 LVOT Area:     2.84 cm  LV Volumes (MOD) LV vol d, MOD A2C: 17.4 ml LV vol d, MOD A4C: 47.6 ml LV vol s, MOD A2C: 7.8 ml LV vol s, MOD A4C: 11.8 ml LV SV MOD A2C:     9.6 ml LV SV MOD A4C:     47.6 ml LV SV MOD BP:      19.7 ml LEFT ATRIUM           Index LA Vol (A2C): 20.9 ml 11.16 ml/m LA Vol (A4C): 31.4 ml 16.77 ml/m  AORTIC VALVE AV Area (Vmax):    2.04 cm AV Area (Vmean):   1.97 cm AV Area (VTI):     2.03 cm AV Vmax:           121.00 cm/s AV Vmean:          80.500 cm/s AV VTI:            0.229 m AV Peak Grad:      5.9 mmHg AV Mean Grad:      3.0 mmHg LVOT Vmax:         86.90 cm/s LVOT Vmean:        56.000 cm/s LVOT VTI:          0.164 m LVOT/AV VTI ratio: 0.72  AORTA Ao Root diam: 3.60 cm Ao Asc diam:  2.70 cm MITRAL VALVE MV Area (PHT): 3.50 cm    SHUNTS MV Decel Time: 217 msec    Systemic VTI:  0.16 m MV E velocity: 60.00 cm/s  Systemic Diam: 1.90 cm MV A velocity: 73.60 cm/s MV E/A ratio:  0.82 Kirk Ruths MD Electronically signed by Kirk Ruths MD Signature Date/Time: 06/20/2020/3:39:30 PM    Final     Procedures Procedures   Medications Ordered in ED Medications  atenolol (TENORMIN) tablet 50 mg (50 mg Oral Not Given 06/20/20 1138)  irbesartan (AVAPRO) tablet 300 mg (300 mg Oral Not Given 06/20/20 1425)    And  hydrochlorothiazide (HYDRODIURIL) tablet 25 mg (25 mg Oral Not Given 06/20/20 1425)  alum & mag hydroxide-simeth (MAALOX/MYLANTA) 200-200-20 MG/5ML suspension 30 mL (30 mLs Oral Given 06/20/20 0925)    And  lidocaine (XYLOCAINE) 2 % viscous mouth solution 15 mL (15 mLs Oral Given 06/20/20 0925)  hydrALAZINE (APRESOLINE) injection 10 mg (10 mg Intravenous Given 06/20/20 1103)    ED Course  I have reviewed the triage vital signs and the nursing notes.  Pertinent labs & imaging results  that were available during my care of the patient were reviewed by me and considered in my medical decision making (see chart for details).    MDM Rules/Calculators/A&P HEAR Score: 64                        60 year old comes in w/ chief complaint of chest pain.  Chest pain - uncertain etiology. HEAR score is 3. Trops x2 neg. CXR shows questionable pulmonary vascular congestion.  He denies any shortness of breath or orthopnea.  His chest pain was worse when he is laying flat.  EKG is not showing any evidence of pericarditis.  Questionable pericardial effusion versus CHF.  Given that patient has spina bifida and difficulty with getting to appointments, moreover he does not have a cardiologist I discussed the case with cardiology service who recommended that we get echo here and they will follow-up in the clinic.  Echocardiogram results does not show any evidence of CHF or valvular issues.  Patient's BP normalized over time.  Delta troponin negative.  No respiratory distress at any point.   Clinical suspicion for PE is extremely low, BNP was negative also.  Stable for discharge.  Discussed the case with cardiology service again.  They  will call the patient to set up an appointment  Final Clinical Impression(s) / ED Diagnoses Final diagnoses:  Precordial chest pain  Cardiomegaly    Rx / DC Orders ED Discharge Orders     None        Varney Biles, MD 06/20/20 1645

## 2020-06-20 NOTE — ED Triage Notes (Signed)
Pt came in with c/o L sided chest pain. States it as sharp and intermittent. Pt states that he was trying to go to bed when he felt a sharp pain in his chest. It went away soon after. He has had a total of three episodes. States his legs are more swollen than normal

## 2020-06-27 DIAGNOSIS — R079 Chest pain, unspecified: Secondary | ICD-10-CM | POA: Diagnosis not present

## 2020-06-27 DIAGNOSIS — K219 Gastro-esophageal reflux disease without esophagitis: Secondary | ICD-10-CM | POA: Diagnosis not present

## 2020-07-11 DIAGNOSIS — Z20822 Contact with and (suspected) exposure to covid-19: Secondary | ICD-10-CM | POA: Diagnosis not present

## 2020-07-11 IMAGING — CT CT ABD-PELV W/ CM
2 of 5 series · 15 of 46 positions shown, 17 images · IV contrast (omnipaque)
Comparison: CT July 03, 2018

CLINICAL DATA: Diarrhea and fatigue for several days

EXAM:
CT ABDOMEN AND PELVIS WITH CONTRAST
TECHNIQUE: Multidetector CT imaging of the abdomen and pelvis was performed
using the standard protocol following bolus administration of
intravenous contrast.
CONTRAST:  100mL OMNIPAQUE IOHEXOL 300 MG/ML  SOLN

[Series 2: axial st · axial · 0.82mm/px · z∈[-465,-85]mm · 12 of 88 slices shown, 14 images]
[im 6/88  soft-tissue]
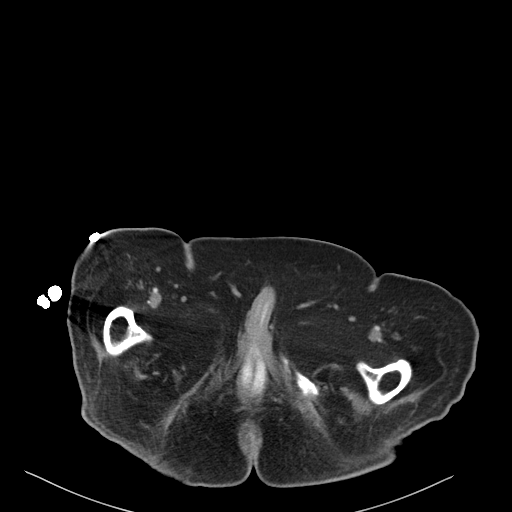
[im 6/88  bone]
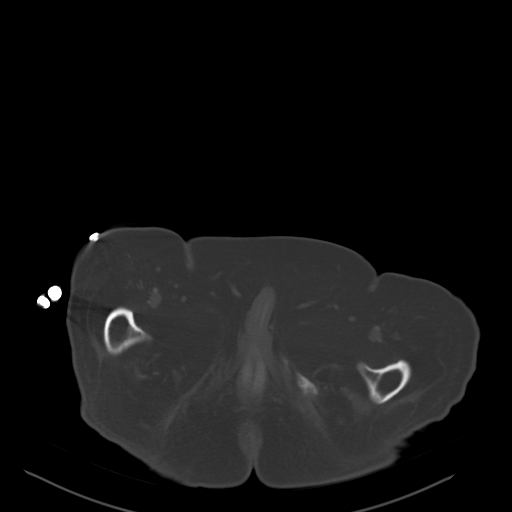
[im 12/88  soft-tissue]
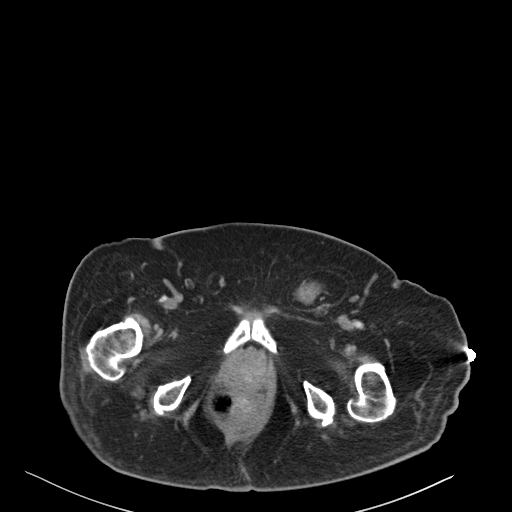
[im 18/88  soft-tissue]
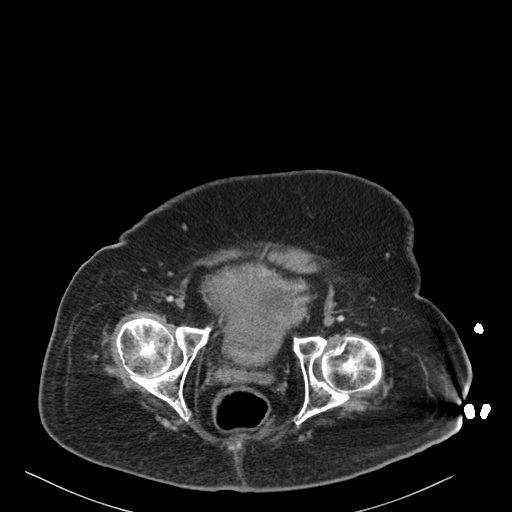
[im 30/88  soft-tissue]
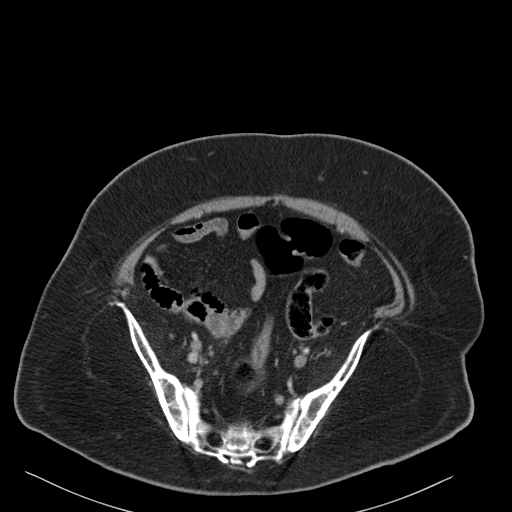
[im 35/88  soft-tissue]
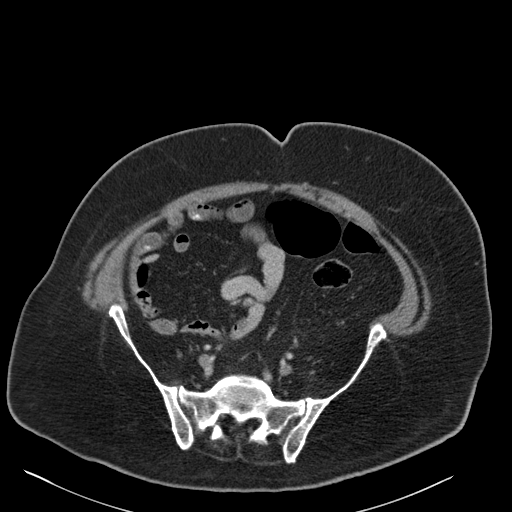
[im 41/88  soft-tissue]
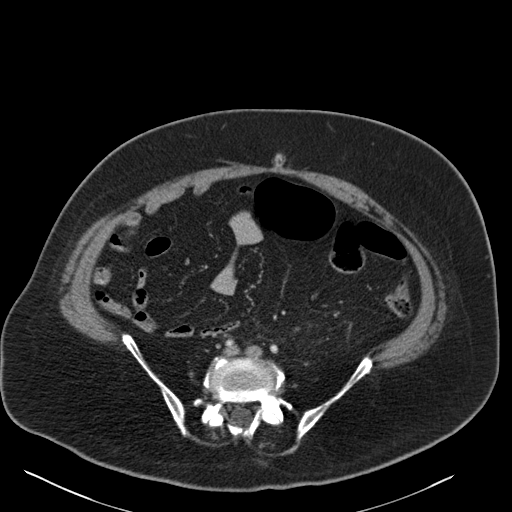
[im 47/88  soft-tissue]
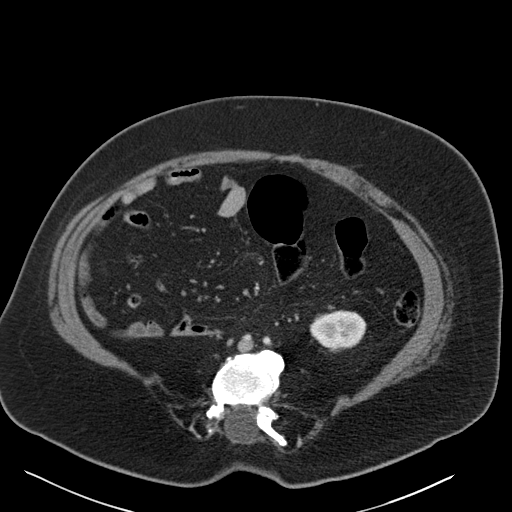
[im 53/88  soft-tissue]
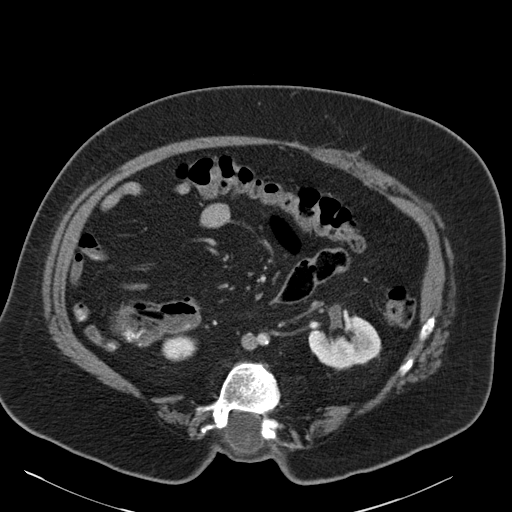
[im 59/88  soft-tissue]
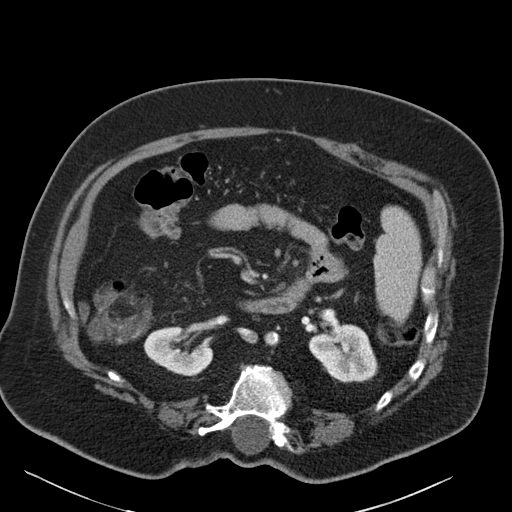
[im 59/88  bone]
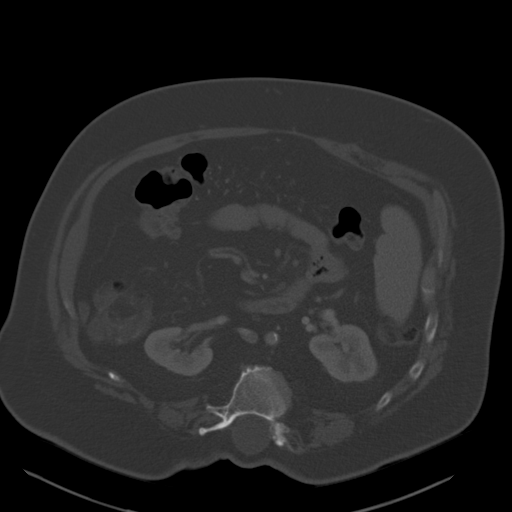
[im 70/88  soft-tissue]
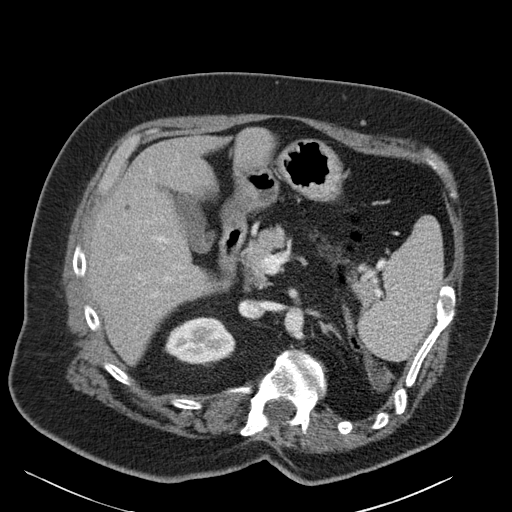
[im 76/88  soft-tissue]
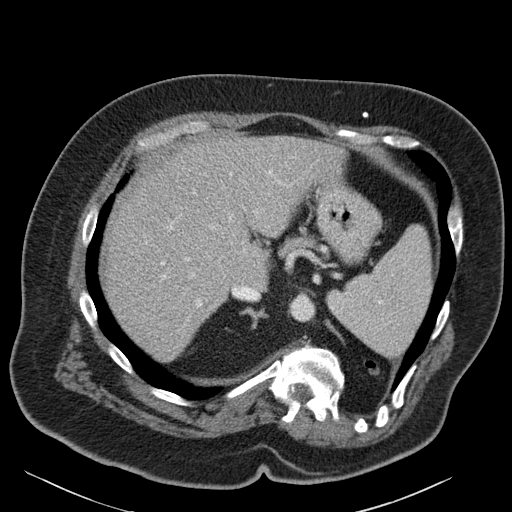
[im 82/88  soft-tissue]
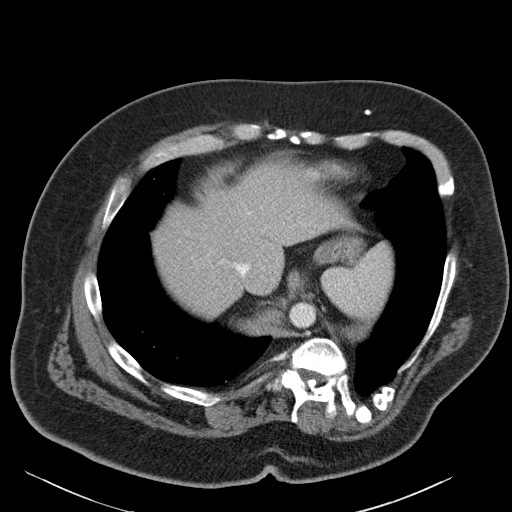

[Series 5: coronal st · coronal · 0.83mm/px · 3 of 169 slices shown]
[im 57/169  soft-tissue]
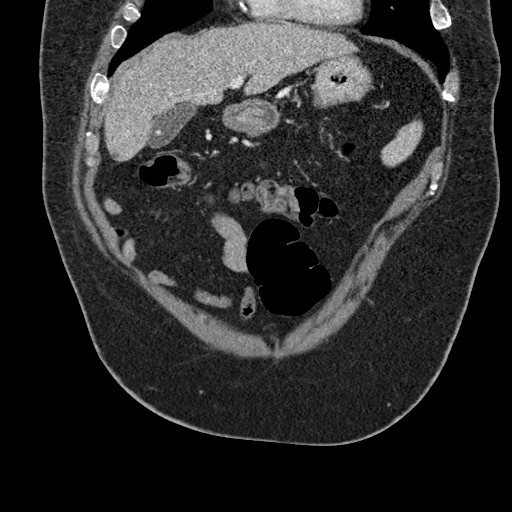
[im 75/169  soft-tissue]
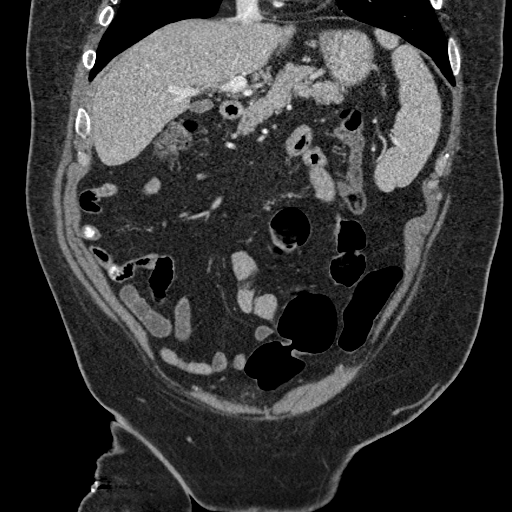
[im 94/169  soft-tissue]
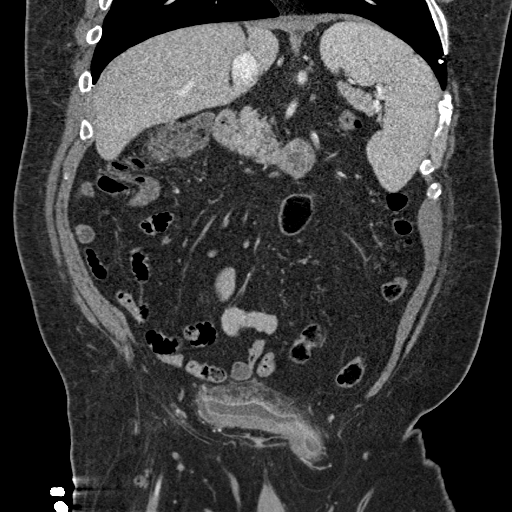

[15 of 46 positions shown; findings below may reference images not displayed]

FINDINGS: Lower chest: Lung bases are clear. Normal heart size. No pericardial
effusion. Atherosclerotic calcification of the coronary arteries.
Abundant pericardial and subpleural fat is noted.

Hepatobiliary: Few subcentimeter hypoattenuating foci in the liver
are too small to fully characterize on CT imaging but statistically
likely benign. No concerning focal liver abnormality. Calcified
gallstone layering dependently in the gallbladder. No
pericholecystic inflammation or gallbladder distention. No biliary
ductal dilatation or visible calcified intraductal gallstones.

Pancreas: Unremarkable. No pancreatic ductal dilatation or
surrounding inflammatory changes.

Spleen: Normal in size without focal abnormality.

Adrenals/Urinary Tract: Adrenal glands are unremarkable. Kidneys are
normal, without renal calculi, focal lesion, or hydronephrosis.
Bladder is largely decompressed but does demonstrate circumferential
thickening which is greater than expected for the degree of
underdistention. Portion of the left anterolateral bladder extends
into the left inguinal hernia. There is a region of extensive fat
stranding along the dome of the bladder which is similar to slightly
increased from prior exam and may reflect chronic inflammatory
change.

Stomach/Bowel: Distal esophagus, stomach and duodenal sweep are
unremarkable. No bowel wall thickening or dilatation. No evidence of
obstruction. A normal appendix is visualized. No colonic dilatation
or wall thickening.

Vascular/Lymphatic: Atherosclerotic plaque within the normal caliber
aorta. No aneurysm or ectasia. Major venous structures are
unremarkable. No suspicious or enlarged lymph nodes in the included
lymphatic chains.

Reproductive: Borderline prostatomegaly. Seminal vesicles are
unremarkable.

Other: No abdominopelvic free fluid or free gas. No bowel containing
hernias. Bilateral fat containing inguinal hernias are present. Left
inguinal hernia contains a portion of the thickened bladder wall.
Region of focal stranding along the dome of the bladder, as detailed
above.

Musculoskeletal: There is severe levocurvature of the thoracolumbar
spine. Associated chest wall deformity is seen. Incomplete fusion of
the posterior elements of the lower thoracic and lumbar levels
compatible with spina bifida. Few bone islands are present in the
right femoral head. Marked muscular atrophy.
IMPRESSION: 1. Circumferential thickening of the urinary bladder wall, greater
than expected for the degree of underdistention. There is a region
of extensive fat stranding along the dome of the bladder which is
similar to slightly increased from prior exam and may reflect
chronic inflammatory change. Features may suggest an acute cystitis
on a background of neurogenic bladder. Correlate with patient's
symptoms and consider urinalysis.
2. Left inguinal hernia contains a portion of the thickened bladder
wall. No evidence of bowel obstruction.
3. Mild prostatomegaly.
4. Cholelithiasis without evidence of acute cholecystitis.
5. Incomplete fusion of the posterior elements of lower thoracic and
lumbar spine, compatible with spina bifida.
6. Marked atrophy of the paraspinal musculature and musculature of
the pelvis.
7. Aortic Atherosclerosis ([id number]-OL1.1).

## 2020-07-19 DIAGNOSIS — Z20822 Contact with and (suspected) exposure to covid-19: Secondary | ICD-10-CM | POA: Diagnosis not present

## 2020-07-26 NOTE — Progress Notes (Signed)
Cardiology Office Note:    Date:  07/27/2020   ID:  Kenneth Arroyo, DOB June 09, 1960, MRN [id number]  PCP:  Deland Pretty, MD   [facility] Providers Cardiologist:  Werner Lean, MD     CC: Feels tired and run down Consulted for the evaluation of CP at the behest of Deland Pretty, MD  History of Present Illness:    Kenneth Arroyo is a 60 y.o. male with a hx of PAD, Aortic atherosclerosis, HTN, Spina Bifida with difficult echos, and GERD who presents for evaluation 07/27/20.  Patient notes that he is feeling much worse than he used to lately.  When he was younger played lots of wheelchair basketball without issues (up until 1995 when he had R shoulder surgery).  Lately feels some hard heart beats quickly associated with a sharp beat; with this his heart started beating faster.  No associated syncope or near syncope.  Chest pain is sternal in nature and radiates to his side. Felt like a needle sticking in him. 06/20/20 was the event- he had negative troponin and work up.  Symptoms didn't improve with magic mouthwash.  No SOB, DOE, PND, Orthopnea.  No leg claudication but has had no feeling in his legs since age 9 outside of very top of legs.  No Fen-Phen or drug use.   Past Medical History:  Diagnosis Date   Arterial occlusive disease    Cold feet Nov 2013   Bilateral cold feet , discolored.   Cutaneous skin tags    Cyanosis    Esophageal reflux    Gall bladder polyp    History of fractured kneecap    after fall from wheelchair while playing sports; no surgery , placed in cast    History of paraplegia    Secondary to spina bifida    Hypertension    Memory difficulty 11/05/2017   Neurogenic bladder, NOS    Self-catheterizes urinary bladder    Spina bifida without mention of hydrocephalus, unspecified region    UTI (lower urinary tract infection)     Past Surgical History:  Procedure Laterality Date   bone spur surgery  Right    right shoulder    COLONOSCOPY  2001    [facility] SHUNT  infancy   CYSTOSCOPY WITH URETHRAL DILATATION N/A 10/15/2018   Procedure: CYSTOSCOPY WITH URETHRAL balloon DILATATION;  Surgeon: Irine Seal, MD;  Location: [facility];  Service: Urology;  Laterality: N/A;   TRANSURETHRAL RESECTION OF BLADDER TUMOR N/A 10/15/2018   Procedure: TRANSURETHRAL RESECTION OF BLADDER LESION(TURBT);  Surgeon: Irine Seal, MD;  Location: [facility];  Service: Urology;  Laterality: N/A;   UPPER GASTROINTESTINAL ENDOSCOPY      Current Medications: Current Meds  Medication Sig   acyclovir (ZOVIRAX) 400 MG tablet Take 400 mg by mouth as needed.   aspirin EC 81 MG tablet Take 1 tablet (81 mg total) by mouth daily. Swallow whole.   atenolol (TENORMIN) 50 MG tablet Take 50 mg by mouth daily.    cetirizine (ZYRTEC) 10 MG tablet Take 10 mg by mouth daily.   diphenhydrAMINE (BENADRYL) 12.5 MG/5ML liquid Take 6.25 mg by mouth at bedtime as needed for sleep.   levothyroxine (SYNTHROID, LEVOTHROID) 75 MCG tablet Take 75 mcg by mouth daily before breakfast.    olmesartan-hydrochlorothiazide (BENICAR HCT) 40-25 MG tablet Take 1 tablet by mouth daily.   omeprazole (PRILOSEC) 20 MG capsule Take 20 mg by mouth 2 (two) times daily before a meal.   vitamin B-12 (CYANOCOBALAMIN) 1000  MCG tablet Take 1,000 mcg by mouth daily.      Allergies:   Amlodipine, Amoxicillin, Doxycycline, Sulfa antibiotics, Cefuroxime axetil, Coricidin d cold-flu-sinus [chlorphen-pe-acetaminophen], Famotidine, Fish oil, and Folic acid   Social History   Socioeconomic History   Marital status: Single    Spouse name: Not on file   Number of children: Not on file   Years of education: Not on file   Highest education level: Not on file  Occupational History   Not on file  Tobacco Use   Smoking status: Never   Smokeless tobacco: Never  Substance and Sexual Activity   Alcohol use: No   Drug use: No   Sexual activity: Not on file  Other Topics Concern   Not on file  Social History Narrative   Not on  file   Social Determinants of Health   Financial Resource Strain: Not on file  Food Insecurity: Not on file  Transportation Needs: Not on file  Physical Activity: Not on file  Stress: Not on file  Social Connections: Not on file    Social: Lives by himself  Family History: The patient's family history includes Alzheimer's disease in his mother; Atrial fibrillation in his father; Cataracts in his father; Dementia in his mother; Hyperlipidemia in his mother; Hypertension in his father; Kidney failure in his father; Other in his sister.  ROS:   Please see the history of present illness.    Notes chronic hip and leg pain  All other systems reviewed and are negative.  EKGs/Labs/Other Studies Reviewed:    The following studies were reviewed today:  EKG:  EKG is  ordered today.  The ekg ordered today demonstrates  07/26/20: Sinus bradycardia rate 55 WNL (low voltage P Wave)  Transthoracic Echocardiogram: Date: 06/20/20 Results: Off axis with robust LV function 1. Extremely limited study; limited views available.   2. Left ventricular ejection fraction, by estimation, is 70 to 75%. The  left ventricle has hyperdynamic function. The left ventricle has no  regional wall motion abnormalities. Left ventricular diastolic parameters  are consistent with Grade I diastolic  dysfunction (impaired relaxation).   3. Right ventricular systolic function was not well visualized. The right  ventricular size is not well visualized.   4. The mitral valve is grossly normal. No evidence of mitral valve  regurgitation. No evidence of mitral stenosis.   5. Tricuspid valve regurgitation not assessed.   6. The aortic valve was not well visualized. Aortic valve regurgitation  is not visualized. No aortic stenosis is present.   7. Pulmonic valve regurgitation not assessed.   NonCardiac CT: Date: 10/05/19 Results: Abdominal aortic atherosclerosis  ABI & Duplex: Date 11/12/2011 Results: At rest, the  right ABI is 0.74, left 0.82. Segmental pressures suggest primarily iliofemoral disease bilaterally. There is a triphasic wave form in the right femoral level, biphasic distally. On the left, triphasic wave forms through the popliteal artery, monophasic distally. Pulse volume recording is generally symmetric throughout. Exercise testing was not performed.  -Moderate bilateral lower extremity arterial occlusive disease rest, probably primarily iliofemoral. Should the patient fail conservative treatment, consider CTA runoff (higher spatial resolution) or MRA runoff (no radiation risk, can be performed noncontrast in the setting of renal dysfunction) to better define the site and nature of arterial occlusive disease and delineate treatment options.   Recent Labs: 06/20/2020: ALT 34; B Natriuretic Peptide 30.0; BUN 18; Creatinine, Ser 0.86; Hemoglobin 14.0; Platelets 134; Potassium 3.4; Sodium 137  Recent Lipid Panel No results found for:  CHOL, TRIG, HDL, CHOLHDL, VLDL, LDLCALC, LDLDIRECT   Physical Exam:    VS:  BP 128/78   Pulse (!) 55     Wt Readings from Last 3 Encounters:  10/15/18 91.4 kg  12/18/11 77.1 kg     GEN: Obese gentleman in no acute distress; spina bifida notable HEENT: WNL; R eye deviation NECK: No JVD; No carotid bruits LYMPHATICS: No lymphadenopathy CARDIAC: regular bradycardia, no murmurs, rubs, gallops; distant heart and lung sounds RESPIRATORY:  Clear to auscultation without rales, wheezing or rhonchi  ABDOMEN: Soft, non-tender, non-distended MUSCULOSKELETAL:  Non-pitting edema; SKIN: Warm and dry NEUROLOGIC:  Alert and oriented x 3, has feeling in the tops of bilateral legs only PSYCHIATRIC:  Normal affect   ASSESSMENT:    1. Chest pain, unspecified type   2. Chest pain of uncertain etiology   3. Essential hypertension   4. Aortic atherosclerosis ([place])   5. PAD (peripheral artery disease) ([place])   6. Spina bifida of thoracic region without  hydrocephalus ([place])    PLAN:    Chest Pain Syndrome associated with palpations HTN Aortic Atherosclerosis PAD Spina Bifida - financial difficulties  - Would start ASA 81 mg Po Daily (patient has concerns with upset stomach on this in the past) -Continue atneolol 50 mg daily Continue olmesartan-HCTZ 40-25 mg  - ABM BP 120/70s - Would recommend pharmacological nuclear medicine stress test (NPO at midnight/hold atenolol); discussed risks, benefits, and alternatives of the diagnostic procedure including chest pain, arrhythmia, and death.  Patient amenable for testing. - will offer 14 day non-live ziopatch (patient declined at this time -offered repeat ABI and Duplex and recheck lipids; patient will defer at this time -patient notes that he feels flush with contrast - LDL 131   Three months follow up unless new symptoms or abnormal test results warranting change in plan   Shared Decision Making/Informed Consent The risks [chest pain, shortness of breath, cardiac arrhythmias, dizziness, blood pressure fluctuations, myocardial infarction, stroke/transient ischemic attack, nausea, vomiting, allergic reaction, radiation exposure, metallic taste sensation and life-threatening complications (estimated to be 1 in 10,000)], benefits (risk stratification, diagnosing coronary artery disease, treatment guidance) and alternatives of a nuclear stress test were discussed in detail with Mr. Peddie and he agrees to proceed.    Medication Adjustments/Labs and Tests Ordered: Current medicines are reviewed at length with the patient today.  Concerns regarding medicines are outlined above.  Orders Placed This Encounter  Procedures   MYOCARDIAL PERFUSION IMAGING   EKG 12-Lead    Meds ordered this encounter  Medications   aspirin EC 81 MG tablet    Sig: Take 1 tablet (81 mg total) by mouth daily. Swallow whole.    Dispense:  90 tablet    Refill:  3     Patient Instructions  Medication Instructions:   Your physician has recommended you make the following change in your medication:   START Aspirin 81 mg taking 1 daily    *If you need a refill on your cardiac medications before your next appointment, please call your pharmacy*   Lab Work: None ordered  If you have labs (blood work) drawn today and your tests are completely normal, you will receive your results only by: [place] (if you have MyChart) OR A paper copy in the mail If you have any lab test that is abnormal or we need to change your treatment, we will call you to review the results.   Testing/Procedures: Your physician has requested that you have a lexiscan  myoview. For further information please visit [email or web address]. Please follow instruction sheet, BELOW:    You are scheduled for a Myocardial Perfusion Imaging Study  Please arrive 15 minutes prior to your appointment time for registration and insurance purposes.  The test will take approximately 3 to 4 hours to complete; you may bring reading material.  If someone comes with you to your appointment, they will need to remain in the main lobby due to limited space in the testing area. **If you are pregnant or breastfeeding, please notify the nuclear lab prior to your appointment**  How to prepare for your Myocardial Perfusion Test: Do not eat or drink 3 hours prior to your test, except you may have water. Do not consume products containing caffeine (regular or decaffeinated) 12 hours prior to your test. (ex: coffee, chocolate, sodas, tea). Do bring a list of your current medications with you.  If not listed below, you may take your medications as normal. Do wear comfortable clothes (no dresses or overalls) and walking shoes, tennis shoes preferred (No heels or open toe shoes are allowed). Do NOT wear cologne, perfume, aftershave, or lotions (deodorant is allowed). If these instructions are not followed, your test will have to be  rescheduled.    Follow-Up: At [facility], you and your health needs are our priority.  As part of our continuing mission to provide you with exceptional heart care, we have created designated Provider Care Teams.  These Care Teams include your primary Cardiologist (physician) and Advanced Practice Providers (APPs -  Physician Assistants and Nurse Practitioners) who all work together to provide you with the care you need, when you need it.  We recommend signing up for the patient portal called "MyChart".  Sign up information is provided on this After Visit Summary.  MyChart is used to connect with patients for Virtual Visits (Telemedicine).  Patients are able to view lab/test results, encounter notes, upcoming appointments, etc.  Non-urgent messages can be sent to your provider as well.   To learn more about what you can do with MyChart, go to [email or web address].    Your next appointment:   3-4 month(s)  The format for your next appointment:   In Person  Provider:   You may see Werner Lean, MD or one of the following Advanced Practice Providers on your designated Care Team:   Melina Copa, PA-C Ermalinda Barrios, PA-C   Other Instructions    Signed, Werner Lean, MD  07/27/2020 10:26 AM    [place]

## 2020-07-27 ENCOUNTER — Encounter: Payer: Self-pay | Admitting: Internal Medicine

## 2020-07-27 ENCOUNTER — Encounter: Payer: Self-pay | Admitting: *Deleted

## 2020-07-27 ENCOUNTER — Ambulatory Visit (INDEPENDENT_AMBULATORY_CARE_PROVIDER_SITE_OTHER): Payer: Medicare Other | Admitting: Internal Medicine

## 2020-07-27 ENCOUNTER — Other Ambulatory Visit: Payer: Self-pay

## 2020-07-27 VITALS — BP 128/78 | HR 55

## 2020-07-27 DIAGNOSIS — R072 Precordial pain: Secondary | ICD-10-CM

## 2020-07-27 DIAGNOSIS — I10 Essential (primary) hypertension: Secondary | ICD-10-CM

## 2020-07-27 DIAGNOSIS — I739 Peripheral vascular disease, unspecified: Secondary | ICD-10-CM | POA: Diagnosis not present

## 2020-07-27 DIAGNOSIS — I700 Atherosclerosis of aorta: Secondary | ICD-10-CM | POA: Diagnosis not present

## 2020-07-27 DIAGNOSIS — R079 Chest pain, unspecified: Secondary | ICD-10-CM | POA: Diagnosis not present

## 2020-07-27 DIAGNOSIS — Q056 Thoracic spina bifida without hydrocephalus: Secondary | ICD-10-CM

## 2020-07-27 MED ORDER — ASPIRIN EC 81 MG PO TBEC: 81.0000 mg | DELAYED_RELEASE_TABLET | Freq: Every day | ORAL | 3 refills | Status: DC

## 2020-07-27 NOTE — Patient Instructions (Addendum)
Medication Instructions:  Your physician has recommended you make the following change in your medication:   START Aspirin 81 mg taking 1 daily    *If you need a refill on your cardiac medications before your next appointment, please call your pharmacy*   Lab Work: None ordered  If you have labs (blood work) drawn today and your tests are completely normal, you will receive your results only by: [place] (if you have MyChart) OR A paper copy in the mail If you have any lab test that is abnormal or we need to change your treatment, we will call you to review the results.   Testing/Procedures: Your physician has requested that you have a lexiscan myoview. For further information please visit [email or web address]. Please follow instruction sheet, BELOW:    You are scheduled for a Myocardial Perfusion Imaging Study  Please arrive 15 minutes prior to your appointment time for registration and insurance purposes.  The test will take approximately 3 to 4 hours to complete; you may bring reading material.  If someone comes with you to your appointment, they will need to remain in the main lobby due to limited space in the testing area. **If you are pregnant or breastfeeding, please notify the nuclear lab prior to your appointment**  How to prepare for your Myocardial Perfusion Test: Do not eat or drink 3 hours prior to your test, except you may have water. Do not consume products containing caffeine (regular or decaffeinated) 12 hours prior to your test. (ex: coffee, chocolate, sodas, tea). Do bring a list of your current medications with you.  If not listed below, you may take your medications as normal. Do wear comfortable clothes (no dresses or overalls) and walking shoes, tennis shoes preferred (No heels or open toe shoes are allowed). Do NOT wear cologne, perfume, aftershave, or lotions (deodorant is allowed). If these instructions are not followed, your test will have to be  rescheduled.    Follow-Up: At [facility], you and your health needs are our priority.  As part of our continuing mission to provide you with exceptional heart care, we have created designated Provider Care Teams.  These Care Teams include your primary Cardiologist (physician) and Advanced Practice Providers (APPs -  Physician Assistants and Nurse Practitioners) who all work together to provide you with the care you need, when you need it.  We recommend signing up for the patient portal called "MyChart".  Sign up information is provided on this After Visit Summary.  MyChart is used to connect with patients for Virtual Visits (Telemedicine).  Patients are able to view lab/test results, encounter notes, upcoming appointments, etc.  Non-urgent messages can be sent to your provider as well.   To learn more about what you can do with MyChart, go to [email or web address].    Your next appointment:   3-4 month(s)  The format for your next appointment:   In Person  Provider:   You may see Werner Lean, MD or one of the following Advanced Practice Providers on your designated Care Team:   Melina Copa, PA-C Ermalinda Barrios, PA-C   Other Instructions

## 2020-07-28 ENCOUNTER — Telehealth: Payer: Self-pay

## 2020-07-28 NOTE — Addendum Note (Signed)
Addended by: Precious Gilding on: 07/28/2020 05:02 PM   Modules accepted: Orders

## 2020-07-31 ENCOUNTER — Telehealth: Payer: Self-pay | Admitting: Internal Medicine

## 2020-07-31 NOTE — Telephone Encounter (Signed)
Pt c/o medication issue:  1. Name of Medication: Aspirin 81 mg  2. How are you currently taking this medication (dosage and times per day)? 1  time a day  3. Are you having a reaction (difficulty breathing--STAT)? no  4. What is your medication issue? 30 minutes to an hour after he takes the aspirin he have diarrhea, wants to know  also what can he take for the diarrhea?ke

## 2020-07-31 NOTE — Telephone Encounter (Signed)
Low dose aspirin is not typically associated with diarrhea. He can take whatever formulation he tolerates better, ok to try regular baby aspirin instead. Ok to use Imodium as needed for diarrhea.

## 2020-07-31 NOTE — Telephone Encounter (Signed)
I called pt in regards to aspirin side effect.  He reports that since he started taking medication on Fri 07/28/20 he has diarrhea about 30 minutes later.  He is currently taking Aspirin 81 mg EC he wants to know if a regular baby aspirin would be better.  Will send to pharm D to review.

## 2020-08-02 NOTE — Telephone Encounter (Signed)
Left a message for pt to call back

## 2020-08-02 NOTE — Telephone Encounter (Signed)
Called pt informed of pharmacist reply.  He reports that he stopped taking ASA 81 mg on Monday 07/31/20.  Diarrhea has stopped since he no longer takes aspirin.  He expressed to me that he previously took Aleve/ Advil and also had diarrhea, so he had to stop taking medications.  Will route to MD for further advisement.

## 2020-08-09 DIAGNOSIS — Z20822 Contact with and (suspected) exposure to covid-19: Secondary | ICD-10-CM | POA: Diagnosis not present

## 2020-08-14 ENCOUNTER — Other Ambulatory Visit: Payer: Self-pay

## 2020-08-14 ENCOUNTER — Encounter (HOSPITAL_COMMUNITY)
Admission: RE | Admit: 2020-08-14 | Discharge: 2020-08-14 | Disposition: A | Discharge status: Home / Self Care | Payer: Medicare Other | Source: Ambulatory Visit | Attending: Internal Medicine | Admitting: Internal Medicine

## 2020-08-14 DIAGNOSIS — R079 Chest pain, unspecified: Secondary | ICD-10-CM

## 2020-08-14 DIAGNOSIS — R072 Precordial pain: Secondary | ICD-10-CM

## 2020-08-14 LAB — NM MYOCAR MULTI W/SPECT W/WALL MOTION / EF
Estimated workload: 1 METS
Exercise duration (min): 5 min
Exercise duration (sec): 17 s
LV dias vol: 60 mL (ref 62–150)
LV sys vol: 16 mL
Peak HR: 100 {beats}/min
Rest HR: 59 {beats}/min
TID: 1.04

## 2020-08-14 MED ORDER — REGADENOSON 0.4 MG/5ML IV SOLN
INTRAVENOUS | Status: AC
  Administered 2020-08-14: 0.4 mg via INTRAVENOUS
  Filled 2020-08-14: qty 5

## 2020-08-14 MED ORDER — REGADENOSON 0.4 MG/5ML IV SOLN: 0.4000 mg | Freq: Once | INTRAVENOUS | Status: AC

## 2020-08-14 MED ORDER — TECHNETIUM TC 99M TETROFOSMIN IV KIT
10.6000 | PACK | Freq: Once | INTRAVENOUS | Status: AC | PRN
  Administered 2020-08-14: 10.6 via INTRAVENOUS

## 2020-08-14 MED ORDER — TECHNETIUM TC 99M TETROFOSMIN IV KIT
31.3000 | PACK | Freq: Once | INTRAVENOUS | Status: AC | PRN
  Administered 2020-08-14: 31.3 via INTRAVENOUS

## 2020-08-14 NOTE — Progress Notes (Signed)
Georgina Peer presented for a outpatient nuclear stress test today.  No immediate complications.  Stress imaging is pending at this time.   Preliminary EKG findings may be listed in the chart, but the stress test result will not be finalized until perfusion imaging is complete.   [place], [place]  08/14/2020 10:46 AM

## 2020-08-24 ENCOUNTER — Telehealth: Payer: Self-pay | Admitting: Internal Medicine

## 2020-08-24 NOTE — Telephone Encounter (Signed)
Spoke with pt and has stopped ASA due to after taking for 3 days developed diarrhea and pt also would like to check on Plavix price before sending in script Pt does not have B/P machine either and does not have the funds to purchase a machine Pt missed phone call from Dr Gasper Sells Will forward for review and recommendations ./cy

## 2020-08-24 NOTE — Telephone Encounter (Signed)
Pt returning Dr. Gasper Sells phone call for results... please advise

## 2020-08-25 NOTE — Telephone Encounter (Signed)
Left a message for pt to call back

## 2020-08-29 NOTE — Telephone Encounter (Signed)
Lm to call back ./cy 

## 2020-10-22 NOTE — Progress Notes (Signed)
Cardiology Office Note:    Date:  10/23/2020   ID:  Kenneth Arroyo, DOB 1960/04/01, MRN [id number]  PCP:  Deland Pretty, MD   [facility] Providers Cardiologist:  Werner Lean, MD     CC: Follow up stress test  History of Present Illness:    Kenneth Arroyo is a 60 y.o. male with a hx of PAD, Aortic atherosclerosis, HTN, Spina Bifida with difficult echos, and GERD who presents for evaluation 07/27/20.  Since last visit we performed a NM Stress test that should a small area of LAD disease (low risk study) notes that he has had GI upset with ASA and was unable to tolerate it.  He has significant financial burdens and is unable to afford BP cuff and was unwilling to try plavix because of this.  We reached out to put him in touch with social work.  Seen 10/23/20.  Patient notes that he is doing about the same.   Did not receive social work but did not pick up the phone and wasn't sure if there was a Advertising account executive.  He notes that his next door neighbor was a Freight forwarder and new that this was an issue in the past (gets many phone).  No real changes in finances.    Still has the chest pain but it improved from prior. 1/10 in his left chest on occasion.   Also notes right should pain and thinks that he has been having more difficulty transferring and think there could be a MSK component (had a history of a bone spur)  Past Medical History:  Diagnosis Date   Arterial occlusive disease    Cold feet Nov 2013   Bilateral cold feet , discolored.   Cutaneous skin tags    Cyanosis    Esophageal reflux    Gall bladder polyp    History of fractured kneecap    after fall from wheelchair while playing sports; no surgery , placed in cast    History of paraplegia    Secondary to spina bifida    Hypertension    Memory difficulty 11/05/2017   Neurogenic bladder, NOS    Self-catheterizes urinary bladder    Spina bifida without mention of hydrocephalus, unspecified region    UTI (lower urinary  tract infection)     Past Surgical History:  Procedure Laterality Date   bone spur surgery  Right    right shoulder    COLONOSCOPY  2001   CSF SHUNT  infancy   CYSTOSCOPY WITH URETHRAL DILATATION N/A 10/15/2018   Procedure: CYSTOSCOPY WITH URETHRAL balloon DILATATION;  Surgeon: Irine Seal, MD;  Location: [facility];  Service: Urology;  Laterality: N/A;   TRANSURETHRAL RESECTION OF BLADDER TUMOR N/A 10/15/2018   Procedure: TRANSURETHRAL RESECTION OF BLADDER LESION(TURBT);  Surgeon: Irine Seal, MD;  Location: [facility];  Service: Urology;  Laterality: N/A;   UPPER GASTROINTESTINAL ENDOSCOPY      Current Medications: Current Meds  Medication Sig   acyclovir (ZOVIRAX) 400 MG tablet Take 400 mg by mouth as needed.   atenolol (TENORMIN) 50 MG tablet Take 50 mg by mouth daily.    cetirizine (ZYRTEC) 10 MG tablet Take 10 mg by mouth daily.   diphenhydrAMINE (BENADRYL) 12.5 MG/5ML liquid Take 6.25 mg by mouth at bedtime as needed for sleep.   levothyroxine (SYNTHROID, LEVOTHROID) 75 MCG tablet Take 75 mcg by mouth daily before breakfast.    olmesartan-hydrochlorothiazide (BENICAR HCT) 40-25 MG tablet Take 1 tablet by mouth daily.   omeprazole (  PRILOSEC) 20 MG capsule Take 20 mg by mouth 2 (two) times daily before a meal.   vitamin B-12 (CYANOCOBALAMIN) 1000 MCG tablet Take 1,000 mcg by mouth daily.      Allergies:   Amlodipine, Amoxicillin, Doxycycline, Sulfa antibiotics, Cefuroxime axetil, Coricidin d cold-flu-sinus [chlorphen-pe-acetaminophen], Famotidine, Fish oil, and Folic acid   Social History   Socioeconomic History   Marital status: Single    Spouse name: Not on file   Number of children: Not on file   Years of education: Not on file   Highest education level: Not on file  Occupational History   Not on file  Tobacco Use   Smoking status: Never   Smokeless tobacco: Never  Substance and Sexual Activity   Alcohol use: No   Drug use: No   Sexual activity: Not on file  Other Topics  Concern   Not on file  Social History Narrative   Not on file   Social Determinants of Health   Financial Resource Strain: Not on file  Food Insecurity: Not on file  Transportation Needs: Not on file  Physical Activity: Not on file  Stress: Not on file  Social Connections: Not on file    Social: Lives by himself, there are significant financial difficulties related to his spina bifida and social issues  Family History: The patient's family history includes Alzheimer's disease in his mother; Atrial fibrillation in his father; Cataracts in his father; Dementia in his mother; Hyperlipidemia in his mother; Hypertension in his father; Kidney failure in his father; Other in his sister.  ROS:   Please see the history of present illness.    All other systems reviewed and are negative.  EKGs/Labs/Other Studies Reviewed:    The following studies were reviewed today:  EKG:   07/26/20: Sinus bradycardia rate 55 WNL (low voltage P Wave)  Transthoracic Echocardiogram: Date: 06/20/20 Results: Off axis with robust LV function 1. Extremely limited study; limited views available.   2. Left ventricular ejection fraction, by estimation, is 70 to 75%. The  left ventricle has hyperdynamic function. The left ventricle has no  regional wall motion abnormalities. Left ventricular diastolic parameters  are consistent with Grade I diastolic  dysfunction (impaired relaxation).   3. Right ventricular systolic function was not well visualized. The right  ventricular size is not well visualized.   4. The mitral valve is grossly normal. No evidence of mitral valve  regurgitation. No evidence of mitral stenosis.   5. Tricuspid valve regurgitation not assessed.   6. The aortic valve was not well visualized. Aortic valve regurgitation  is not visualized. No aortic stenosis is present.   7. Pulmonic valve regurgitation not assessed.   NonCardiac CT: Date: 10/05/19 Results: Abdominal aortic  atherosclerosis  ABI & Duplex: Date 11/12/2011 Results: At rest, the right ABI is 0.74, left 0.82. Segmental pressures suggest primarily iliofemoral disease bilaterally. There is a triphasic wave form in the right femoral level, biphasic distally. On the left, triphasic wave forms through the popliteal artery, monophasic distally. Pulse volume recording is generally symmetric throughout. Exercise testing was not performed.  -Moderate bilateral lower extremity arterial occlusive disease rest, probably primarily iliofemoral. Should the patient fail conservative treatment, consider CTA runoff (higher spatial resolution) or MRA runoff (no radiation risk, can be performed noncontrast in the setting of renal dysfunction) to better define the site and nature of arterial occlusive disease and delineate treatment options.   Recent Labs: 06/20/2020: ALT 34; B Natriuretic Peptide 30.0; BUN 18; Creatinine, Ser  0.86; Hemoglobin 14.0; Platelets 134; Potassium 3.4; Sodium 137  Recent Lipid Panel No results found for: CHOL, TRIG, HDL, CHOLHDL, VLDL, LDLCALC, LDLDIRECT   Physical Exam:    VS:  BP (!) 150/64   Pulse (!) 56   Wt 208 lb (94.3 kg)   SpO2 97%   BMI 40.62 kg/m     Wt Readings from Last 3 Encounters:  10/23/20 208 lb (94.3 kg)  10/15/18 201 lb 8 oz (91.4 kg)  12/18/11 170 lb (77.1 kg)     GEN: Obese in no acute distress; spina bifida notable HEENT: WNL; R eye deviation NECK: No JVD LYMPHATICS: No lymphadenopathy CARDIAC: regular bradycardia, no murmurs, rubs, gallops; distant heart and lung sounds RESPIRATORY:  Clear to auscultation without rales, wheezing or rhonchi, decreased breath sounds ABDOMEN: Soft, non-tender, non-distended MUSCULOSKELETAL:  Non-pitting edema similar from prior SKIN: Warm and dry NEUROLOGIC:  Alert and oriented x 3, has feeling in the tops of bilateral legs only PSYCHIATRIC:  Normal affect   ASSESSMENT:    1. Coronary artery disease of native  artery of native heart with stable angina pectoris ([place])   2. PAD (peripheral artery disease) ([place])   3. Aortic atherosclerosis ([place])   4. Spina bifida of thoracic region without hydrocephalus ([place])   5. Essential hypertension     PLAN:    Coronary Artery disease: symptoms have greatly improved on no therapy HTN Aortic Atherosclerosis PAD Spina Bifida -with notable  financial difficulties  - unable to antiplatelet challenge (does not tolerate ASA and does not feel comfortable trying again, cannot afford plavix) - has had issues transferring and though falls have improved, he has concern about start plavix - Continue atneolol 50 mg daily no room to uptitrate - Continue olmesartan-HCTZ 40-25 mg ; next medical may me hydralazine or Aldactone - swelling with norvasc - LDL 131, Statin would be considered; repeat LDL offered; he will see with his PCP in November and send [place] the results - working to get amb BP cuff  - Will reach out to Social Work   Six month follow up  Time Spent Directly with Patient:   I have spent a total of 40 minutes with the patient reviewing notes, imaging, EKGs, labs and examining the patient as well as establishing an assessment and plan that was discussed personally with the patient.  > 50% of time was spent in direct patient care and ambulatory BP teaching with cuff.  Medication Adjustments/Labs and Tests Ordered: Current medicines are reviewed at length with the patient today.  Concerns regarding medicines are outlined above.  No orders of the defined types were placed in this encounter.    No orders of the defined types were placed in this encounter.    Patient Instructions  Medication Instructions:  Your physician recommends that you continue on your current medications as directed. Please refer to the Current Medication list given to you today.  *If you need a refill on your cardiac medications before your next appointment, please call your  pharmacy*   Lab Work: Please have Lipid panel drawn at PCP appointment in Nov. Have results sent to Dr. Gasper Sells.  If you have labs (blood work) drawn today and your tests are completely normal, you will receive your results only by: [place] (if you have MyChart) OR A paper copy in the mail If you have any lab test that is abnormal or we need to change your treatment, we will call you to review the results.  Testing/Procedures: NONE   Follow-Up: At [facility], you and your health needs are our priority.  As part of our continuing mission to provide you with exceptional heart care, we have created designated Provider Care Teams.  These Care Teams include your primary Cardiologist (physician) and Advanced Practice Providers (APPs -  Physician Assistants and Nurse Practitioners) who all work together to provide you with the care you need, when you need it.  We recommend signing up for the patient portal called "MyChart".  Sign up information is provided on this After Visit Summary.  MyChart is used to connect with patients for Virtual Visits (Telemedicine).  Patients are able to view lab/test results, encounter notes, upcoming appointments, etc.  Non-urgent messages can be sent to your provider as well.   To learn more about what you can do with MyChart, go to [email or web address].    Your next appointment:   6 month(s)  The format for your next appointment:   In Person  Provider:   You may see Werner Lean, MD or one of the following Advanced Practice Providers on your designated Care Team:   Melina Copa, PA-C Ermalinda Barrios, PA-C   Other Instructions We have provided you with a BP cuff.  Please check your BP at least 3 times per week.     Signed, Werner Lean, MD  10/23/2020 9:16 AM    [place]

## 2020-10-23 ENCOUNTER — Other Ambulatory Visit: Payer: Self-pay

## 2020-10-23 ENCOUNTER — Encounter: Payer: Self-pay | Admitting: Internal Medicine

## 2020-10-23 ENCOUNTER — Ambulatory Visit (INDEPENDENT_AMBULATORY_CARE_PROVIDER_SITE_OTHER): Payer: Medicare Other | Admitting: Internal Medicine

## 2020-10-23 VITALS — BP 150/64 | HR 56 | Wt 208.0 lb

## 2020-10-23 DIAGNOSIS — I739 Peripheral vascular disease, unspecified: Secondary | ICD-10-CM

## 2020-10-23 DIAGNOSIS — I10 Essential (primary) hypertension: Secondary | ICD-10-CM | POA: Diagnosis not present

## 2020-10-23 DIAGNOSIS — Q056 Thoracic spina bifida without hydrocephalus: Secondary | ICD-10-CM | POA: Diagnosis not present

## 2020-10-23 DIAGNOSIS — I700 Atherosclerosis of aorta: Secondary | ICD-10-CM

## 2020-10-23 DIAGNOSIS — I25118 Atherosclerotic heart disease of native coronary artery with other forms of angina pectoris: Secondary | ICD-10-CM | POA: Diagnosis not present

## 2020-10-23 NOTE — Patient Instructions (Addendum)
Medication Instructions:  Your physician recommends that you continue on your current medications as directed. Please refer to the Current Medication list given to you today.  *If you need a refill on your cardiac medications before your next appointment, please call your pharmacy*   Lab Work: Please have Lipid panel drawn at PCP appointment in Nov. Have results sent to Dr. Gasper Sells.  If you have labs (blood work) drawn today and your tests are completely normal, you will receive your results only by: [place] (if you have MyChart) OR A paper copy in the mail If you have any lab test that is abnormal or we need to change your treatment, we will call you to review the results.   Testing/Procedures: NONE   Follow-Up: At [facility], you and your health needs are our priority.  As part of our continuing mission to provide you with exceptional heart care, we have created designated Provider Care Teams.  These Care Teams include your primary Cardiologist (physician) and Advanced Practice Providers (APPs -  Physician Assistants and Nurse Practitioners) who all work together to provide you with the care you need, when you need it.  We recommend signing up for the patient portal called "MyChart".  Sign up information is provided on this After Visit Summary.  MyChart is used to connect with patients for Virtual Visits (Telemedicine).  Patients are able to view lab/test results, encounter notes, upcoming appointments, etc.  Non-urgent messages can be sent to your provider as well.   To learn more about what you can do with MyChart, go to [email or web address].    Your next appointment:   6 month(s)  The format for your next appointment:   In Person  Provider:   You may see Werner Lean, MD or one of the following Advanced Practice Providers on your designated Care Team:   Melina Copa, PA-C Ermalinda Barrios, PA-C   Other Instructions We have provided you with a BP  cuff.  Please check your BP at least 3 times per week.

## 2020-10-25 ENCOUNTER — Telehealth: Payer: Self-pay | Admitting: Licensed Clinical Social Worker

## 2020-10-25 DIAGNOSIS — I10 Essential (primary) hypertension: Secondary | ICD-10-CM

## 2020-10-25 NOTE — Addendum Note (Signed)
Addended by: Louann Liv on: 10/25/2020 11:19 AM   Modules accepted: Orders

## 2020-10-25 NOTE — Telephone Encounter (Signed)
CSW received request to reach out to patient to assist with financial support and possible BP cuff. CSW has attempted several times with messages left. CSW will await return call. Raquel Sarna, [place], [place]

## 2020-12-06 DIAGNOSIS — I10 Essential (primary) hypertension: Secondary | ICD-10-CM | POA: Diagnosis not present

## 2020-12-06 DIAGNOSIS — Z125 Encounter for screening for malignant neoplasm of prostate: Secondary | ICD-10-CM | POA: Diagnosis not present

## 2020-12-06 DIAGNOSIS — E7801 Familial hypercholesterolemia: Secondary | ICD-10-CM | POA: Diagnosis not present

## 2020-12-06 DIAGNOSIS — E039 Hypothyroidism, unspecified: Secondary | ICD-10-CM | POA: Diagnosis not present

## 2020-12-08 DIAGNOSIS — I739 Peripheral vascular disease, unspecified: Secondary | ICD-10-CM | POA: Diagnosis not present

## 2020-12-08 DIAGNOSIS — F321 Major depressive disorder, single episode, moderate: Secondary | ICD-10-CM | POA: Diagnosis not present

## 2020-12-08 DIAGNOSIS — S90412A Abrasion, left great toe, initial encounter: Secondary | ICD-10-CM | POA: Diagnosis not present

## 2020-12-08 DIAGNOSIS — E7801 Familial hypercholesterolemia: Secondary | ICD-10-CM | POA: Diagnosis not present

## 2020-12-08 DIAGNOSIS — N319 Neuromuscular dysfunction of bladder, unspecified: Secondary | ICD-10-CM | POA: Diagnosis not present

## 2020-12-08 DIAGNOSIS — I10 Essential (primary) hypertension: Secondary | ICD-10-CM | POA: Diagnosis not present

## 2020-12-08 DIAGNOSIS — Z0000 Encounter for general adult medical examination without abnormal findings: Secondary | ICD-10-CM | POA: Diagnosis not present

## 2020-12-08 DIAGNOSIS — Q059 Spina bifida, unspecified: Secondary | ICD-10-CM | POA: Diagnosis not present

## 2020-12-08 DIAGNOSIS — E039 Hypothyroidism, unspecified: Secondary | ICD-10-CM | POA: Diagnosis not present

## 2020-12-08 DIAGNOSIS — R413 Other amnesia: Secondary | ICD-10-CM | POA: Diagnosis not present

## 2020-12-08 DIAGNOSIS — E876 Hypokalemia: Secondary | ICD-10-CM | POA: Diagnosis not present

## 2020-12-08 DIAGNOSIS — Z125 Encounter for screening for malignant neoplasm of prostate: Secondary | ICD-10-CM | POA: Diagnosis not present

## 2020-12-08 DIAGNOSIS — K219 Gastro-esophageal reflux disease without esophagitis: Secondary | ICD-10-CM | POA: Diagnosis not present

## 2020-12-16 ENCOUNTER — Encounter (HOSPITAL_COMMUNITY): Payer: Self-pay | Admitting: Emergency Medicine

## 2020-12-16 ENCOUNTER — Emergency Department (HOSPITAL_COMMUNITY)
Admission: EM | Admit: 2020-12-16 | Discharge: 2020-12-16 | Disposition: A | Discharge status: Home / Self Care | Payer: Medicare Other | Attending: Emergency Medicine | Admitting: Emergency Medicine

## 2020-12-16 DIAGNOSIS — I2510 Atherosclerotic heart disease of native coronary artery without angina pectoris: Secondary | ICD-10-CM | POA: Diagnosis not present

## 2020-12-16 DIAGNOSIS — I119 Hypertensive heart disease without heart failure: Secondary | ICD-10-CM | POA: Insufficient documentation

## 2020-12-16 DIAGNOSIS — L03012 Cellulitis of left finger: Secondary | ICD-10-CM | POA: Insufficient documentation

## 2020-12-16 DIAGNOSIS — Z79899 Other long term (current) drug therapy: Secondary | ICD-10-CM | POA: Diagnosis not present

## 2020-12-16 DIAGNOSIS — M79645 Pain in left finger(s): Secondary | ICD-10-CM | POA: Diagnosis present

## 2020-12-16 NOTE — ED Notes (Signed)
Wound covered and finger splint applied per MD request.

## 2020-12-16 NOTE — ED Triage Notes (Signed)
PT reports pain surrounding L thumb nail with redness noticed this morning. Denies drainage.

## 2020-12-16 NOTE — Discharge Instructions (Signed)
Soak your thumb in warm water 3 times per day for 5-10 minutes at a time in warm water, gently massaging your left thumb like I showed you.  Do this for the next 3 days.

## 2020-12-16 NOTE — ED Provider Notes (Signed)
[facility] DEPT Provider Note   CSN: [id number] Arrival date & time: 12/16/20  1203     History Chief Complaint  Patient presents with   Hand Pain    Kenneth Arroyo is a 60 y.o. male presenting to the emergency department with left thumb pain.  The patient ports that he had swelling and pain at the base of his left thumb for the past several days.  He is wheelchair-bound and wonders whether he scraped his thumb while using a wheelchair.  He has multiple antibiotic allergies and prefers to avoid antibiotics if possible.  He has a history of a paronychia that was drained in the past.         Past Medical History:  Diagnosis Date   Arterial occlusive disease    Cold feet Nov 2013   Bilateral cold feet , discolored.   Cutaneous skin tags    Cyanosis    Esophageal reflux    Gall bladder polyp    History of fractured kneecap    after fall from wheelchair while playing sports; no surgery , placed in cast    History of paraplegia    Secondary to spina bifida    Hypertension    Memory difficulty 11/05/2017   Neurogenic bladder, NOS    Self-catheterizes urinary bladder    Spina bifida without mention of hydrocephalus, unspecified region    UTI (lower urinary tract infection)     Patient Active Problem List   Diagnosis Date Noted   Coronary artery disease of native artery of native heart with stable angina pectoris ([place]) 10/23/2020   Essential hypertension 07/27/2020   Aortic atherosclerosis ([place]) 07/27/2020   Spina bifida of thoracic region without hydrocephalus ([place]) 07/27/2020   Hypervascular lesion of urinary bladder 10/15/2018   Memory difficulty 11/05/2017   PAD (peripheral artery disease) ([place]) 12/18/2011    Past Surgical History:  Procedure Laterality Date   bone spur surgery  Right    right shoulder    COLONOSCOPY  2001   CSF SHUNT  infancy   CYSTOSCOPY WITH URETHRAL DILATATION N/A 10/15/2018   Procedure: CYSTOSCOPY WITH URETHRAL  balloon DILATATION;  Surgeon: Irine Seal, MD;  Location: [facility];  Service: Urology;  Laterality: N/A;   TRANSURETHRAL RESECTION OF BLADDER TUMOR N/A 10/15/2018   Procedure: TRANSURETHRAL RESECTION OF BLADDER LESION(TURBT);  Surgeon: Irine Seal, MD;  Location: [facility];  Service: Urology;  Laterality: N/A;   UPPER GASTROINTESTINAL ENDOSCOPY         Family History  Problem Relation Age of Onset   Alzheimer's disease Mother    Hyperlipidemia Mother    Dementia Mother    Cataracts Father    Hypertension Father    Kidney failure Father    Atrial fibrillation Father    Other Sister        varicose veins    Social History   Tobacco Use   Smoking status: Never   Smokeless tobacco: Never  Substance Use Topics   Alcohol use: No   Drug use: No    Home Medications Prior to Admission medications   Medication Sig Start Date End Date Taking? Authorizing Provider  acyclovir (ZOVIRAX) 400 MG tablet Take 400 mg by mouth as needed.    [provider]  atenolol (TENORMIN) 50 MG tablet Take 50 mg by mouth daily.     [provider]  cetirizine (ZYRTEC) 10 MG tablet Take 10 mg by mouth daily.    [provider]  diphenhydrAMINE (  BENADRYL) 12.5 MG/5ML liquid Take 6.25 mg by mouth at bedtime as needed for sleep.    [provider]  levothyroxine (SYNTHROID, LEVOTHROID) 75 MCG tablet Take 75 mcg by mouth daily before breakfast.  09/03/17   [provider]  olmesartan-hydrochlorothiazide (BENICAR HCT) 40-25 MG tablet Take 1 tablet by mouth daily.    [provider]  omeprazole (PRILOSEC) 20 MG capsule Take 20 mg by mouth 2 (two) times daily before a meal.    [provider]  vitamin B-12 (CYANOCOBALAMIN) 1000 MCG tablet Take 1,000 mcg by mouth daily.     [provider]    Allergies    Amlodipine, Amoxicillin, Doxycycline, Sulfa antibiotics, Cefuroxime axetil, Coricidin d cold-flu-sinus [chlorphen-pe-acetaminophen], Famotidine,  Fish oil, and Folic acid  Review of Systems   Review of Systems  Constitutional:  Negative for chills and fever.  Cardiovascular:  Negative for chest pain and palpitations.  Musculoskeletal:  Positive for myalgias.  Skin:  Positive for rash. Negative for color change.  Neurological:  Negative for syncope and headaches.  All other systems reviewed and are negative.  Physical Exam Updated Vital Signs BP (!) 148/83 (BP Location: Left Arm)   Pulse (!) 59   Temp 98.4 F (36.9 C) (Oral)   Resp 16   SpO2 99%   Physical Exam Constitutional:      General: He is not in acute distress.    Comments: Paraplegic  HENT:     Head: Normocephalic and atraumatic.  Eyes:     Conjunctiva/sclera: Conjunctivae normal.     Pupils: Pupils are equal, round, and reactive to light.  Cardiovascular:     Rate and Rhythm: Normal rate and regular rhythm.  Pulmonary:     Effort: Pulmonary effort is normal. No respiratory distress.  Skin:    General: Skin is warm and dry.     Comments: Paronychia left thumb  Neurological:     General: No focal deficit present.     Mental Status: He is alert. Mental status is at baseline.  Psychiatric:        Mood and Affect: Mood normal.    ED Results / Procedures / Treatments   Labs (all labs ordered are listed, but only abnormal results are displayed) Labs Reviewed - No data to display  EKG None  Radiology No results found.  Procedures .Marland KitchenIncision and Drainage  Date/Time: 12/16/2020 1:46 PM Performed by: Wyvonnia Dusky, MD Authorized by: Wyvonnia Dusky, MD   Consent:    Consent obtained:  Verbal   Consent given by:  Patient   Risks discussed:  Bleeding, incomplete drainage and infection Universal protocol:    Procedure explained and questions answered to patient or proxy's satisfaction: yes     Immediately prior to procedure, a time out was called: yes     Patient identity confirmed:  Arm band Location:    Type:  Abscess   Location:  Upper  extremity   Upper extremity location:  Finger   Finger location:  L thumb Pre-procedure details:    Skin preparation:  Povidone-iodine Sedation:    Sedation type:  None Anesthesia:    Anesthesia method:  None Procedure type:    Complexity:  Simple Procedure details:    Incision types:  Stab incision   Drainage:  Purulent   Packing materials:  None Post-procedure details:    Procedure completion:  Tolerated well, no immediate complications   Medications Ordered in ED Medications - No data to display  ED Course  I have reviewed the triage vital signs and the nursing notes.  Pertinent labs & imaging results that were available during my care of the patient were reviewed by me and considered in my medical decision making (see chart for details).  Patient is here with a simple paronychia of the left thumb.  No evidence of deep space infection or spreading infection of the thumb.  I was able to clean the skin surface and then drained the paronychia, with a moderate amount of pus, maybe 1 cc total.  I prefer to avoid antibiotics as he has a history of multidrug-resistant UTIs as well as multiple antibiotic allergies, and I do not suspect this is a significant infection.  I advised to [facility], we can provide him with some bandaging so does not rub on the wheelchair, we discussed warm soaks at home.  Okay for discharge    Final Clinical Impression(s) / ED Diagnoses Final diagnoses:  Paronychia of finger of left hand    Rx / DC Orders ED Discharge Orders     None        Wilsie Kern, Carola Rhine, MD 12/16/20 1428

## 2021-01-10 DIAGNOSIS — E7801 Familial hypercholesterolemia: Secondary | ICD-10-CM | POA: Diagnosis not present

## 2021-02-15 DIAGNOSIS — I10 Essential (primary) hypertension: Secondary | ICD-10-CM | POA: Diagnosis not present

## 2021-02-15 DIAGNOSIS — Q059 Spina bifida, unspecified: Secondary | ICD-10-CM | POA: Diagnosis not present

## 2021-02-15 DIAGNOSIS — L729 Follicular cyst of the skin and subcutaneous tissue, unspecified: Secondary | ICD-10-CM | POA: Diagnosis not present

## 2021-02-28 ENCOUNTER — Telehealth: Payer: Self-pay | Admitting: Internal Medicine

## 2021-02-28 NOTE — Telephone Encounter (Signed)
° °  Kenneth Arroyo with Dr. Pennie Banter office calling, she said their RN did a home visit to the pt and pt's vital signs today  BP 148/78 right arm  HR 92 apical  spo2 97% Pt's denied, CP, SOB, change in level in consciousnes  and palpitations

## 2021-02-28 NOTE — Telephone Encounter (Signed)
Vitals look good, will file into chart. No concerns noted.

## 2021-03-11 DIAGNOSIS — Z20828 Contact with and (suspected) exposure to other viral communicable diseases: Secondary | ICD-10-CM | POA: Diagnosis not present

## 2021-03-12 DIAGNOSIS — X32XXXD Exposure to sunlight, subsequent encounter: Secondary | ICD-10-CM | POA: Diagnosis not present

## 2021-03-12 DIAGNOSIS — L8990 Pressure ulcer of unspecified site, unspecified stage: Secondary | ICD-10-CM | POA: Diagnosis not present

## 2021-03-12 DIAGNOSIS — L570 Actinic keratosis: Secondary | ICD-10-CM | POA: Diagnosis not present

## 2021-03-15 DIAGNOSIS — R051 Acute cough: Secondary | ICD-10-CM | POA: Diagnosis not present

## 2021-03-15 DIAGNOSIS — Z20822 Contact with and (suspected) exposure to covid-19: Secondary | ICD-10-CM | POA: Diagnosis not present

## 2021-03-15 DIAGNOSIS — R059 Cough, unspecified: Secondary | ICD-10-CM | POA: Diagnosis not present

## 2021-04-13 DIAGNOSIS — Z20822 Contact with and (suspected) exposure to covid-19: Secondary | ICD-10-CM | POA: Diagnosis not present

## 2021-04-23 DIAGNOSIS — Z20822 Contact with and (suspected) exposure to covid-19: Secondary | ICD-10-CM | POA: Diagnosis not present

## 2021-05-08 NOTE — Progress Notes (Signed)
?Cardiology Office Note:   ? ?Date:  05/11/2021  ? ?ID:  Kenneth Arroyo, DOB [birth year], MRN [id number] ? ?PCP:  Deland Pretty, MD ?  ?[facility] Providers ?Cardiologist:  Werner Lean, MD    ? ?CC: Follow up stress test ? ?History of Present Illness:   ? ?Kenneth Arroyo is a 61 y.o. male with a hx of PAD, Aortic atherosclerosis, HTN, Spina Bifida with difficult echos, and GERD who presents for evaluation 07/27/20.   ?2022: NM Stress test that should a small area of LAD disease (low risk study) notes that he has had GI upset with ASA and was unable to tolerate it.  He has significant financial burdens and is unable to afford BP cuff and was unwilling to try plavix because of this.  We have several telemarketer calls and does not respond to calls/voicemail. He has not picked up when social work has called or called back because of this. Concern a portion of his pain is related to a bone spur. ? ?Patient notes that he is doing about the same- things are not doing a lot better from a social or financial situation. ?His hot water heater is broken, they have been trying to do renovations for his disability. ?There are no interval hospital/ED visit.   ?Has met with social work.  They are checking blood pressure at home (gets 20 minutes service a month).  She can't get  ? ?No chest pain or pressure .  No SOB/DOE and no PND/Orthopnea.  No weight gain or leg swelling.  No palpitations or syncope. ? ?Ambulatory blood pressure not done. ? ? ?Past Medical History:  ?Diagnosis Date  ? Arterial occlusive disease   ? Cold feet Nov 2013  ? Bilateral cold feet , discolored.  ? Cutaneous skin tags   ? Cyanosis   ? Esophageal reflux   ? Gall bladder polyp   ? History of fractured kneecap   ? after fall from wheelchair while playing sports; no surgery , placed in cast   ? History of paraplegia   ? Secondary to spina bifida   ? Hypertension   ? Memory difficulty 11/05/2017  ? Neurogenic bladder, NOS   ? Self-catheterizes urinary  bladder   ? Spina bifida without mention of hydrocephalus, unspecified region   ? UTI (lower urinary tract infection)   ? ? ?Past Surgical History:  ?Procedure Laterality Date  ? bone spur surgery  Right   ? right shoulder   ? COLONOSCOPY  2001  ? CSF SHUNT  infancy  ? CYSTOSCOPY WITH URETHRAL DILATATION N/A 10/15/2018  ? Procedure: CYSTOSCOPY WITH URETHRAL balloon DILATATION;  Surgeon: Irine Seal, MD;  Location: [facility];  Service: Urology;  Laterality: N/A;  ? TRANSURETHRAL RESECTION OF BLADDER TUMOR N/A 10/15/2018  ? Procedure: TRANSURETHRAL RESECTION OF BLADDER LESION(TURBT);  Surgeon: Irine Seal, MD;  Location: [facility];  Service: Urology;  Laterality: N/A;  ? UPPER GASTROINTESTINAL ENDOSCOPY    ? ? ?Current Medications: ?Current Meds  ?Medication Sig  ? acyclovir (ZOVIRAX) 400 MG tablet Take 400 mg by mouth as needed.  ? atenolol (TENORMIN) 50 MG tablet Take 50 mg by mouth daily.   ? cetirizine (ZYRTEC) 10 MG tablet Take 10 mg by mouth daily.  ? diphenhydrAMINE (BENADRYL) 12.5 MG/5ML liquid Take 6.25 mg by mouth at bedtime as needed for sleep.  ? levothyroxine (SYNTHROID, LEVOTHROID) 75 MCG tablet Take 75 mcg by mouth daily before breakfast.   ? olmesartan-hydrochlorothiazide (BENICAR HCT) 40-25 MG  tablet Take 1 tablet by mouth daily.  ? omeprazole (PRILOSEC) 20 MG capsule Take 20 mg by mouth 2 (two) times daily before a meal.  ? rosuvastatin (CRESTOR) 10 MG tablet Take 10 mg by mouth daily.  ? vitamin B-12 (CYANOCOBALAMIN) 1000 MCG tablet Take 1,000 mcg by mouth daily.   ?  ? ?Allergies:   Amlodipine, Amoxicillin, Doxycycline, Sulfa antibiotics, Cefuroxime axetil, Coricidin d cold-flu-sinus [chlorphen-pe-acetaminophen], Famotidine, Fish oil, and Folic acid  ? ?Social History  ? ?Socioeconomic History  ? Marital status: Single  ?  Spouse name: Not on file  ? Number of children: Not on file  ? Years of education: Not on file  ? Highest education level: Not on file  ?Occupational History  ? Not on file  ?Tobacco Use   ? Smoking status: Never  ? Smokeless tobacco: Never  ?Substance and Sexual Activity  ? Alcohol use: No  ? Drug use: No  ? Sexual activity: Not on file  ?Other Topics Concern  ? Not on file  ?Social History Narrative  ? Not on file  ? ?Social Determinants of Health  ? ?Financial Resource Strain: Not on file  ?Food Insecurity: Not on file  ?Transportation Needs: Not on file  ?Physical Activity: Not on file  ?Stress: Not on file  ?Social Connections: Not on file  ?  ?Social: Lives by himself, there are significant financial difficulties related to his spina bifida and social issues that stem from lack of support ? ?Family History: ?The patient's family history includes Alzheimer's disease in his mother; Atrial fibrillation in his father; Cataracts in his father; Dementia in his mother; Hyperlipidemia in his mother; Hypertension in his father; Kidney failure in his father; Other in his sister. ? ?ROS:   ?Please see the history of present illness.    ?All other systems reviewed and are negative. ? ?EKGs/Labs/Other Studies Reviewed:   ? ?The following studies were reviewed today: ? ?EKG:   ?05/11/21: Marked sinus bradycardia rate 49 ?07/26/20: Sinus bradycardia rate 55 WNL (low voltage P Wave) ? ?Transthoracic Echocardiogram: ?Date: 06/20/20 ?Results: Off axis with robust LV function ?1. Extremely limited study; limited views available.  ? 2. Left ventricular ejection fraction, by estimation, is 70 to 75%. The  ?left ventricle has hyperdynamic function. The left ventricle has no  ?regional wall motion abnormalities. Left ventricular diastolic parameters  ?are consistent with Grade I diastolic  ?dysfunction (impaired relaxation).  ? 3. Right ventricular systolic function was not well visualized. The right  ?ventricular size is not well visualized.  ? 4. The mitral valve is grossly normal. No evidence of mitral valve  ?regurgitation. No evidence of mitral stenosis.  ? 5. Tricuspid valve regurgitation not assessed.  ? 6. The  aortic valve was not well visualized. Aortic valve regurgitation  ?is not visualized. No aortic stenosis is present.  ? 7. Pulmonic valve regurgitation not assessed.  ? ?NonCardiac CT: ?Date: 10/05/19 ?Results: ?Abdominal aortic atherosclerosis ? ?ABI & Duplex: ?Date 11/12/2011 ?Results: ?At rest, the right ABI is 0.74, left 0.82. Segmental pressures ?suggest primarily iliofemoral disease bilaterally. ?There is a triphasic wave form in the right femoral level, biphasic ?distally. On the left, triphasic wave forms through the popliteal ?artery, monophasic distally. Pulse volume recording is generally ?symmetric throughout. ?Exercise testing was not performed. ? ?-Moderate bilateral lower extremity arterial occlusive disease ?rest, probably primarily iliofemoral. Should the patient fail ?conservative treatment, consider CTA runoff (higher spatial ?resolution) or MRA runoff (no radiation risk, can be performed ?noncontrast in  the setting of renal dysfunction) to better define ?the site and nature of arterial occlusive disease and delineate ?treatment options. ? ? ?Recent Labs: ?06/20/2020: ALT 34; B Natriuretic Peptide 30.0; BUN 18; Creatinine, Ser 0.86; Hemoglobin 14.0; Platelets 134; Potassium 3.4; Sodium 137  ?Recent Lipid Panel ?No results found for: CHOL, TRIG, HDL, CHOLHDL, VLDL, LDLCALC, LDLDIRECT ? ? ?Physical Exam:   ? ?VS:  BP 124/73   Pulse (!) 49   SpO2 97%    ? ?Wt Readings from Last 3 Encounters:  ?10/23/20 208 lb (94.3 kg)  ?10/15/18 201 lb 8 oz (91.4 kg)  ?12/18/11 170 lb (77.1 kg)  ?  ? ?GEN: Obese in no acute distress; spina bifida notable ?HEENT: WNL; R eye deviation ?NECK: No JVD ?CARDIAC: regular bradycardia, no murmurs, rubs, gallops; distant heart and lung sounds ?RESPIRATORY:  Clear to auscultation without rales, wheezing or rhonchi ?ABDOMEN: Soft, non-tender, non-distended ?MUSCULOSKELETAL:  bilateral non pitting edema ?SKIN: Warm and dry ?PSYCHIATRIC:  Normal affect  ? ?ASSESSMENT:   ? ?1.  Coronary artery disease of native artery of native heart with stable angina pectoris ([place])   ?2. Essential hypertension   ?3. Aortic atherosclerosis ([place])   ? ? ?PLAN:   ? ?Potential mild Coronary Artery dise

## 2021-05-11 ENCOUNTER — Encounter: Payer: Self-pay | Admitting: Internal Medicine

## 2021-05-11 ENCOUNTER — Ambulatory Visit (INDEPENDENT_AMBULATORY_CARE_PROVIDER_SITE_OTHER): Payer: Medicare Other | Admitting: Internal Medicine

## 2021-05-11 VITALS — BP 124/73 | HR 49

## 2021-05-11 DIAGNOSIS — I700 Atherosclerosis of aorta: Secondary | ICD-10-CM

## 2021-05-11 DIAGNOSIS — I10 Essential (primary) hypertension: Secondary | ICD-10-CM

## 2021-05-11 DIAGNOSIS — I25118 Atherosclerotic heart disease of native coronary artery with other forms of angina pectoris: Secondary | ICD-10-CM

## 2021-05-11 NOTE — Patient Instructions (Addendum)
Medication Instructions:  ?Your physician recommends that you continue on your current medications as directed. Please refer to the Current Medication list given to you today. ? ?*If you need a refill on your cardiac medications before your next appointment, please call your pharmacy* ? ?Lab Work: ?If you have labs (blood work) drawn today and your tests are completely normal, you will receive your results only by: ?MyChart Message (if you have MyChart) OR ?A paper copy in the mail ?If you have any lab test that is abnormal or we need to change your treatment, we will call you to review the results. ? ?Testing/Procedures: ?None ordered today. ? ?Follow-Up: ?At [facility], you and your health needs are our priority.  As part of our continuing mission to provide you with exceptional heart care, we have created designated Provider Care Teams.  These Care Teams include your primary Cardiologist (physician) and Advanced Practice Providers (APPs -  Physician Assistants and Nurse Practitioners) who all work together to provide you with the care you need, when you need it. ? ?We recommend signing up for the patient portal called "MyChart".  Sign up information is provided on this After Visit Summary.  MyChart is used to connect with patients for Virtual Visits (Telemedicine).  Patients are able to view lab/test results, encounter notes, upcoming appointments, etc.  Non-urgent messages can be sent to your provider as well.   ?To learn more about what you can do with MyChart, go to [email or web address].   ? ?Your next appointment:   ?As Needed ? ?The format for your next appointment:   ?In Person ? ?Provider:   ?Werner Lean, MD { ? ? ? ? ?Important Information About Sugar ? ? ? ? ?  ?

## 2021-05-14 DIAGNOSIS — Z20822 Contact with and (suspected) exposure to covid-19: Secondary | ICD-10-CM | POA: Diagnosis not present

## 2021-06-11 DIAGNOSIS — I739 Peripheral vascular disease, unspecified: Secondary | ICD-10-CM | POA: Diagnosis not present

## 2021-06-11 DIAGNOSIS — E876 Hypokalemia: Secondary | ICD-10-CM | POA: Diagnosis not present

## 2021-06-11 DIAGNOSIS — I10 Essential (primary) hypertension: Secondary | ICD-10-CM | POA: Diagnosis not present

## 2021-07-10 DIAGNOSIS — E876 Hypokalemia: Secondary | ICD-10-CM | POA: Diagnosis not present

## 2021-07-25 DIAGNOSIS — E876 Hypokalemia: Secondary | ICD-10-CM | POA: Diagnosis not present

## 2021-08-02 DIAGNOSIS — K921 Melena: Secondary | ICD-10-CM | POA: Diagnosis not present

## 2021-08-02 DIAGNOSIS — R197 Diarrhea, unspecified: Secondary | ICD-10-CM | POA: Diagnosis not present

## 2021-08-02 DIAGNOSIS — L299 Pruritus, unspecified: Secondary | ICD-10-CM | POA: Diagnosis not present

## 2021-08-02 DIAGNOSIS — E876 Hypokalemia: Secondary | ICD-10-CM | POA: Diagnosis not present

## 2021-08-07 DIAGNOSIS — Z5320 Procedure and treatment not carried out because of patient's decision for unspecified reasons: Secondary | ICD-10-CM | POA: Diagnosis not present

## 2021-08-08 DIAGNOSIS — D473 Essential (hemorrhagic) thrombocythemia: Secondary | ICD-10-CM | POA: Diagnosis not present

## 2021-08-16 DIAGNOSIS — I10 Essential (primary) hypertension: Secondary | ICD-10-CM | POA: Diagnosis not present

## 2021-08-16 DIAGNOSIS — E876 Hypokalemia: Secondary | ICD-10-CM | POA: Diagnosis not present

## 2021-08-30 DIAGNOSIS — E876 Hypokalemia: Secondary | ICD-10-CM | POA: Diagnosis not present

## 2021-08-30 DIAGNOSIS — I10 Essential (primary) hypertension: Secondary | ICD-10-CM | POA: Diagnosis not present

## 2021-09-04 DIAGNOSIS — I10 Essential (primary) hypertension: Secondary | ICD-10-CM | POA: Diagnosis not present

## 2021-09-04 DIAGNOSIS — E876 Hypokalemia: Secondary | ICD-10-CM | POA: Diagnosis not present

## 2021-09-04 DIAGNOSIS — L239 Allergic contact dermatitis, unspecified cause: Secondary | ICD-10-CM | POA: Diagnosis not present

## 2021-09-17 DIAGNOSIS — E876 Hypokalemia: Secondary | ICD-10-CM | POA: Diagnosis not present

## 2021-09-17 DIAGNOSIS — L239 Allergic contact dermatitis, unspecified cause: Secondary | ICD-10-CM | POA: Diagnosis not present

## 2021-09-17 DIAGNOSIS — I10 Essential (primary) hypertension: Secondary | ICD-10-CM | POA: Diagnosis not present

## 2021-09-17 DIAGNOSIS — R42 Dizziness and giddiness: Secondary | ICD-10-CM | POA: Diagnosis not present

## 2021-12-10 DIAGNOSIS — Z125 Encounter for screening for malignant neoplasm of prostate: Secondary | ICD-10-CM | POA: Diagnosis not present

## 2021-12-10 DIAGNOSIS — I10 Essential (primary) hypertension: Secondary | ICD-10-CM | POA: Diagnosis not present

## 2021-12-10 DIAGNOSIS — E039 Hypothyroidism, unspecified: Secondary | ICD-10-CM | POA: Diagnosis not present

## 2021-12-13 DIAGNOSIS — H5010 Unspecified exotropia: Secondary | ICD-10-CM | POA: Diagnosis not present

## 2021-12-13 DIAGNOSIS — N319 Neuromuscular dysfunction of bladder, unspecified: Secondary | ICD-10-CM | POA: Diagnosis not present

## 2021-12-13 DIAGNOSIS — I739 Peripheral vascular disease, unspecified: Secondary | ICD-10-CM | POA: Diagnosis not present

## 2021-12-13 DIAGNOSIS — I10 Essential (primary) hypertension: Secondary | ICD-10-CM | POA: Diagnosis not present

## 2021-12-13 DIAGNOSIS — K219 Gastro-esophageal reflux disease without esophagitis: Secondary | ICD-10-CM | POA: Diagnosis not present

## 2021-12-13 DIAGNOSIS — E039 Hypothyroidism, unspecified: Secondary | ICD-10-CM | POA: Diagnosis not present

## 2021-12-13 DIAGNOSIS — I700 Atherosclerosis of aorta: Secondary | ICD-10-CM | POA: Diagnosis not present

## 2021-12-13 DIAGNOSIS — D693 Immune thrombocytopenic purpura: Secondary | ICD-10-CM | POA: Diagnosis not present

## 2021-12-13 DIAGNOSIS — I2510 Atherosclerotic heart disease of native coronary artery without angina pectoris: Secondary | ICD-10-CM | POA: Diagnosis not present

## 2021-12-13 DIAGNOSIS — Z0000 Encounter for general adult medical examination without abnormal findings: Secondary | ICD-10-CM | POA: Diagnosis not present

## 2021-12-13 DIAGNOSIS — R413 Other amnesia: Secondary | ICD-10-CM | POA: Diagnosis not present

## 2021-12-13 DIAGNOSIS — Q051 Thoracic spina bifida with hydrocephalus: Secondary | ICD-10-CM | POA: Diagnosis not present

## 2022-07-19 ENCOUNTER — Telehealth: Payer: Self-pay | Admitting: Internal Medicine

## 2022-07-19 NOTE — Telephone Encounter (Signed)
Patient c/o Palpitations:  High priority if patient c/o lightheadedness, shortness of breath, or chest pain  How long have you had palpitations/irregular HR/ Afib? Are you having the symptoms now?   No  Are you currently experiencing lightheadedness, SOB or CP?  No  Do you have a history of afib (atrial fibrillation) or irregular heart rhythm?   Yes  Have you checked your BP or HR? (document readings if available):  No  Are you experiencing any other symptoms?   Indigestion  Patient stated he had an episode of racing heart which lasted a few seconds along with  sharp chest pain.   ?

## 2022-07-19 NOTE — Telephone Encounter (Signed)
Patient stated that at 2:30 pm today he had sharp pain in his left chest that lasted for a couple of seconds while sitting at his computer. Patient had a low risk stress test in 2022 and at his last visit with Dr. Izora Ribas was to follow-up as needed. Informed patient that this could be indigestion like he thinks, and he can try taking his twice a day omeprazole to see if that helps. Patient denies any other symptoms and he is currently not having any chest pain. Patient stated he was told to let Dr. Izora Ribas know if he ever had any more chest pain. Informed patient that we could send message to Dr. Izora Ribas for advisement. Encouraged patient to go to ED if symptoms come back or get worse.

## 2022-07-22 NOTE — Telephone Encounter (Signed)
uly 14, 2024 Christell Constant, MD  to Ethelda Chick, RN  [facility]   07/21/22 11:09 AM He has a low risk stress test in 2022. He has had significant financial difficulty related to his orthopedic medical problems. We had originally planned for conservative testing.  If high risk CP- needs ED eval. If persister chest pain can see me or team and consider Cardiac CT   Called the pt back and endorsed information as advised above by Dr. Izora Ribas.  He states he has pretty much been asymptomatic since this episode. He said the day he had this episode, he experienced 2 events of chest pain, but has not had anymore since then.  He states he is asymptomatic at this current time from a cardiovascular standpoint.  Pt states he would like to see Dr. Izora Ribas in the next week or so, to further discuss this matter and to have him advising on any necessary testing for this.   Scheduled the pt to come into the office to see Dr. Izora Ribas for next [facility] 7/24 at 0830.  He is aware to arrive 15 mins prior to this appt.   ED precautions provided to the pt, if symptoms were to return/persist/worsen between now and his appt next Wed.   Pt verbalized understanding and agrees with this plan.  Will make Dr. Izora Ribas and RN aware of this plan.

## 2022-07-31 ENCOUNTER — Ambulatory Visit: Payer: Medicare Other | Attending: Internal Medicine | Admitting: Internal Medicine

## 2022-07-31 ENCOUNTER — Encounter: Payer: Self-pay | Admitting: Internal Medicine

## 2022-07-31 VITALS — BP 122/52 | HR 48 | Ht 60.0 in

## 2022-07-31 DIAGNOSIS — I10 Essential (primary) hypertension: Secondary | ICD-10-CM | POA: Diagnosis not present

## 2022-07-31 DIAGNOSIS — Q056 Thoracic spina bifida without hydrocephalus: Secondary | ICD-10-CM | POA: Diagnosis not present

## 2022-07-31 DIAGNOSIS — I25118 Atherosclerotic heart disease of native coronary artery with other forms of angina pectoris: Secondary | ICD-10-CM | POA: Diagnosis not present

## 2022-07-31 DIAGNOSIS — K219 Gastro-esophageal reflux disease without esophagitis: Secondary | ICD-10-CM | POA: Diagnosis not present

## 2022-07-31 MED ORDER — NITROGLYCERIN 0.4 MG SL SUBL: SUBLINGUAL_TABLET | SUBLINGUAL | 3 refills | Status: AC

## 2022-07-31 MED ORDER — OMEPRAZOLE MAGNESIUM 20 MG PO TBEC
20.0000 mg | DELAYED_RELEASE_TABLET | Freq: Two times a day (BID) | ORAL | 3 refills | Status: DC
Start: 2022-07-31 — End: 2022-07-31

## 2022-07-31 MED ORDER — ATENOLOL 25 MG PO TABS: 25.0000 mg | ORAL_TABLET | Freq: Every day | ORAL | 3 refills | Status: DC

## 2022-07-31 MED ORDER — OMEPRAZOLE 20 MG PO CPDR: 20.0000 mg | DELAYED_RELEASE_CAPSULE | Freq: Two times a day (BID) | ORAL | 3 refills | Status: AC

## 2022-07-31 NOTE — Progress Notes (Signed)
Cardiology Office Note:    Date:  07/31/2022   ID:  Kenneth Arroyo, DOB 1960-09-30, MRN [id number]  PCP:  Merri Brunette, MD   [facility] Providers Cardiologist:  Christell Constant, MD     CC: Follow up stress test  History of Present Illness:    Kenneth Arroyo is a 62 y.o. male with a hx of PAD, Aortic atherosclerosis, HTN, Spina Bifida with difficult echos, and GERD who presents for evaluation 07/27/20.   2022: NM Stress test that should a small area of LAD disease (low risk study) notes that he has had GI upset with ASA and was unable to tolerate it.   2023: Unable to afford BP cuff.  Significant social and financial stressors 2024: Returning with episode of CP  Patient notes that he is having two episodes or resting sharp pain.   One chest pain has felt like reflux.  One occurred with palpitations  EKG showed marked sinus bradycardia.   No SOB/DOE and no PND/Orthopnea.  No weight gain or leg swelling.  Chest pain are associated with palpitations.   Past Medical History:  Diagnosis Date   Arterial occlusive disease    Cold feet Nov 2013   Bilateral cold feet , discolored.   Cutaneous skin tags    Cyanosis    Esophageal reflux    Gall bladder polyp    History of fractured kneecap    after fall from wheelchair while playing sports; no surgery , placed in cast    History of paraplegia    Secondary to spina bifida    Hypertension    Memory difficulty 11/05/2017   Neurogenic bladder, NOS    Self-catheterizes urinary bladder    Spina bifida without mention of hydrocephalus, unspecified region    UTI (lower urinary tract infection)     Past Surgical History:  Procedure Laterality Date   bone spur surgery  Right    right shoulder    COLONOSCOPY  2001   [facility] SHUNT  infancy   CYSTOSCOPY WITH URETHRAL DILATATION N/A 10/15/2018   Procedure: CYSTOSCOPY WITH URETHRAL balloon DILATATION;  Surgeon: Bjorn Pippin, MD;  Location: [facility];  Service: Urology;  Laterality: N/A;    TRANSURETHRAL RESECTION OF BLADDER TUMOR N/A 10/15/2018   Procedure: TRANSURETHRAL RESECTION OF BLADDER LESION(TURBT);  Surgeon: Bjorn Pippin, MD;  Location: [facility];  Service: Urology;  Laterality: N/A;   UPPER GASTROINTESTINAL ENDOSCOPY      Current Medications: Current Meds  Medication Sig   acyclovir (ZOVIRAX) 400 MG tablet Take 400 mg by mouth as needed.   atenolol (TENORMIN) 25 MG tablet Take 1 tablet (25 mg total) by mouth daily.   cetirizine (ZYRTEC) 10 MG tablet Take 10 mg by mouth daily.   diphenhydrAMINE (BENADRYL) 12.5 MG/5ML liquid Take 6.25 mg by mouth at bedtime as needed for sleep.   levothyroxine (SYNTHROID, LEVOTHROID) 75 MCG tablet Take 75 mcg by mouth daily before breakfast.    nitroGLYCERIN (NITROSTAT) 0.4 MG SL tablet May take up to 3 tablets   olmesartan-hydrochlorothiazide (BENICAR HCT) 40-25 MG tablet Take 1 tablet by mouth daily.   omeprazole (PRILOSEC) 20 MG capsule Take 1 capsule (20 mg total) by mouth 2 (two) times daily.   rosuvastatin (CRESTOR) 10 MG tablet Take 10 mg by mouth daily.   spironolactone (ALDACTONE) 25 MG tablet Take 25 mg by mouth daily.   vitamin B-12 (CYANOCOBALAMIN) 1000 MCG tablet Take 1,000 mcg by mouth daily.    [DISCONTINUED] atenolol (TENORMIN) 50 MG  tablet Take 50 mg by mouth daily.    [DISCONTINUED] omeprazole (PRILOSEC OTC) 20 MG tablet Take 1 tablet (20 mg total) by mouth 2 (two) times daily.   [DISCONTINUED] omeprazole (PRILOSEC) 20 MG capsule Take 20 mg by mouth daily as needed (heartburn).     Allergies:   Amlodipine, Amoxicillin, Doxycycline, Sulfa antibiotics, Cefuroxime axetil, Coricidin d cold-flu-sinus [chlorphen-pe-acetaminophen], Famotidine, Fish oil, and Folic acid   Social History   Socioeconomic History   Marital status: Single    Spouse name: Not on file   Number of children: Not on file   Years of education: Not on file   Highest education level: Not on file  Occupational History   Not on file  Tobacco Use    Smoking status: Never   Smokeless tobacco: Never  Substance and Sexual Activity   Alcohol use: No   Drug use: No   Sexual activity: Not on file  Other Topics Concern   Not on file  Social History Narrative   Not on file   Social Determinants of Health   Financial Resource Strain: Not on file  Food Insecurity: Not on file  Transportation Needs: Not on file  Physical Activity: Not on file  Stress: Not on file  Social Connections: Not on file    Social: Lives by himself, there are significant financial difficulties related to his spina bifida and social issues that stem from lack of support  Family History: The patient's family history includes Alzheimer's disease in his mother; Atrial fibrillation in his father; Cataracts in his father; Dementia in his mother; Hyperlipidemia in his mother; Hypertension in his father; Kidney failure in his father; Other in his sister.  ROS:   Please see the history of present illness.     EKGs/Labs/Other Studies Reviewed:    The following studies were reviewed today:  EKG:   05/11/21: Marked sinus bradycardia rate 49 07/26/20: Sinus bradycardia rate 55 WNL (low voltage P Wave) Cardiac Studies & Procedures     STRESS TESTS  NM MYOCAR MULTI W/SPECT W 08/14/2020  Narrative  The left ventricular ejection fraction is hyperdynamic (>65%).  Nuclear stress EF: 73%.  This is a low risk study.  There was no ST segment deviation noted during stress.  No T wave inversion was noted during stress.  Findings consistent with prior myocardial infarction.  Defect 1: There is a medium defect of mild severity present in the basal anterior, mid anterior and apical anterior location.  There is a medium size fixed defect of mild severity present in the basal anterior, mid anterior and apical anterior location. Findings suggest prior infarct. No definite ischemia on perfusion images. Preserved EF and mild hypokinesis of the anterior wall. Findings are  overall low risk given fixed perfusion defect and normal LVEF.   ECHOCARDIOGRAM  ECHOCARDIOGRAM COMPLETE 06/20/2020  Narrative ECHOCARDIOGRAM REPORT    Patient Name:   Kenneth Arroyo [facility] Date of Exam: 06/20/2020 Medical Rec #:  [id number]    Height:       60.0 in Accession #:    [id number]   Weight:       201.5 lb Date of Birth:  1961-01-02    BSA:          1.873 m Patient Age:    59 years     BP:           133/79 mmHg Patient Gender: M            HR:  77 bpm. Exam Location:  Inpatient  Procedure: 2D Echo, Color Doppler and Cardiac Doppler  Indications:    Cardiomegaly  History:        Patient has no prior history of Echocardiogram examinations. Risk Factors:Hypertension.  Sonographer:    Neomia Dear RDCS Referring Phys: [id number] ANKIT NANAVATI   Sonographer Comments: Technically difficult study due to poor echo windows and patient is morbidly obese. Image acquisition challenging due to patient body habitus and spina bifida. IMPRESSIONS   1. Extremely limited study; limited views available. 2. Left ventricular ejection fraction, by estimation, is 70 to 75%. The left ventricle has hyperdynamic function. The left ventricle has no regional wall motion abnormalities. Left ventricular diastolic parameters are consistent with Grade I diastolic dysfunction (impaired relaxation). 3. Right ventricular systolic function was not well visualized. The right ventricular size is not well visualized. 4. The mitral valve is grossly normal. No evidence of mitral valve regurgitation. No evidence of mitral stenosis. 5. Tricuspid valve regurgitation not assessed. 6. The aortic valve was not well visualized. Aortic valve regurgitation is not visualized. No aortic stenosis is present. 7. Pulmonic valve regurgitation not assessed.  FINDINGS Left Ventricle: Left ventricular ejection fraction, by estimation, is 70 to 75%. The left ventricle has hyperdynamic function. The left ventricle has no regional  wall motion abnormalities. The left ventricular internal cavity size was normal in size. There is no left ventricular hypertrophy. Left ventricular diastolic parameters are consistent with Grade I diastolic dysfunction (impaired relaxation).  Right Ventricle: The right ventricular size is not well visualized. Right vetricular wall thickness was not assessed. Right ventricular systolic function was not well visualized.  Left Atrium: Left atrial size was normal in size.  Right Atrium: Right atrial size was not well visualized.  Pericardium: There is no evidence of pericardial effusion.  Mitral Valve: The mitral valve is grossly normal. No evidence of mitral valve regurgitation. No evidence of mitral valve stenosis.  Tricuspid Valve: The tricuspid valve is not assessed. Tricuspid valve regurgitation not assessed. No evidence of tricuspid stenosis.  Aortic Valve: The aortic valve was not well visualized. Aortic valve regurgitation is not visualized. No aortic stenosis is present. Aortic valve mean gradient measures 3.0 mmHg. Aortic valve peak gradient measures 5.9 mmHg. Aortic valve area, by VTI measures 2.03 cm.  Pulmonic Valve: The pulmonic valve was not well visualized. Pulmonic valve regurgitation not assessed.  Aorta: The aortic root is normal in size and structure.  Venous: The inferior vena cava was not well visualized.  Additional Comments: Extremely limited study; limited views available.   LEFT VENTRICLE PLAX 2D LVIDd:         3.30 cm     Diastology LVIDs:         1.60 cm     LV e' medial:    6.74 cm/s LV PW:         1.60 cm     LV E/e' medial:  8.9 LV IVS:        1.40 cm     LV e' lateral:   5.11 cm/s LVOT diam:     1.90 cm     LV E/e' lateral: 11.7 LV SV:         46 LV SV Index:   25 LVOT Area:     2.84 cm  LV Volumes (MOD) LV vol d, MOD A2C: 17.4 ml LV vol d, MOD A4C: 47.6 ml LV vol s, MOD A2C: 7.8 ml LV vol s, MOD A4C: 11.8 ml LV  SV MOD A2C:     9.6 ml LV SV  MOD A4C:     47.6 ml LV SV MOD BP:      19.7 ml  LEFT ATRIUM           Index LA Vol (A2C): 20.9 ml 11.16 ml/m LA Vol (A4C): 31.4 ml 16.77 ml/m AORTIC VALVE AV Area (Vmax):    2.04 cm AV Area (Vmean):   1.97 cm AV Area (VTI):     2.03 cm AV Vmax:           121.00 cm/s AV Vmean:          80.500 cm/s AV VTI:            0.229 m AV Peak Grad:      5.9 mmHg AV Mean Grad:      3.0 mmHg LVOT Vmax:         86.90 cm/s LVOT Vmean:        56.000 cm/s LVOT VTI:          0.164 m LVOT/AV VTI ratio: 0.72  AORTA Ao Root diam: 3.60 cm Ao Asc diam:  2.70 cm  MITRAL VALVE MV Area (PHT): 3.50 cm    SHUNTS MV Decel Time: 217 msec    Systemic VTI:  0.16 m MV E velocity: 60.00 cm/s  Systemic Diam: 1.90 cm MV A velocity: 73.60 cm/s MV E/A ratio:  0.82  Olga Millers MD Electronically signed by Olga Millers MD Signature Date/Time: 06/20/2020/3:39:30 PM    Final               Recent Labs: No results found for requested labs within last 365 days.  Recent Lipid Panel No results found for: "CHOL", "TRIG", "HDL", "CHOLHDL", "VLDL", "LDLCALC", "LDLDIRECT"   Physical Exam:    VS:  BP (!) 122/52   Pulse (!) 48   Ht 5' (1.524 m)   SpO2 98%   BMI 40.62 kg/m     Wt Readings from Last 3 Encounters:  10/23/20 208 lb (94.3 kg)  10/15/18 201 lb 8 oz (91.4 kg)  12/18/11 170 lb (77.1 kg)    GEN: Obese in no acute distress; spina bifida notable HEENT: WNL; R eye deviation, poor dentition CARDIAC: regular bradycardia, no murmurs, rubs, gallops; distant heart and lung sounds RESPIRATORY:  Clear to auscultation without rales, wheezing or rhonchi ABDOMEN: Soft, non-tender, non-distended SKIN: Warm and dry PSYCHIATRIC:  Normal affect   ASSESSMENT:    1. Coronary artery disease of native artery of native heart with stable angina pectoris (HCC)   2. Essential hypertension   3. Gastroesophageal reflux disease without esophagitis   4. Spina bifida of thoracic region without  hydrocephalus (HCC)      PLAN:    Atypical CP - increase priolsec to BID  Possible CAD and fatigue - with aortic atherosclerosis and complicated by spina bifida and notable financial difficulties - cannot tolerate anti-platelet therapy - will give PRN nitro - will decrease atenolol to 25 mg PO daily; if palpitations will send ziopatch - if worsening pain will get PET MPI to clarify CAD  - continue statin  HTN - controlled on current therapy; unable to do   Sees PCP in Fall, will see my APP in Winter  Medication Adjustments/Labs and Tests Ordered: Current medicines are reviewed at length with the patient today.  Concerns regarding medicines are outlined above.  Orders Placed This Encounter  Procedures   EKG 12-Lead      Meds  ordered this encounter  Medications   nitroGLYCERIN (NITROSTAT) 0.4 MG SL tablet    Sig: May take up to 3 tablets    Dispense:  25 tablet    Refill:  3   atenolol (TENORMIN) 25 MG tablet    Sig: Take 1 tablet (25 mg total) by mouth daily.    Dispense:  90 tablet    Refill:  3   DISCONTD: omeprazole (PRILOSEC OTC) 20 MG tablet    Sig: Take 1 tablet (20 mg total) by mouth 2 (two) times daily.    Dispense:  180 tablet    Refill:  3   omeprazole (PRILOSEC) 20 MG capsule    Sig: Take 1 capsule (20 mg total) by mouth 2 (two) times daily.    Dispense:  180 capsule    Refill:  3      Patient Instructions  Medication Instructions:  Your physician has recommended you make the following change in your medication:  INCREASE: Omeprazole (Prilosec) to 20 mg by mouth twice daily  DECREASE: atenolol (Tenormin) to 25 mg by mouth once daily  START: Nitroglycerin 0.4 mg under your tongue as needed for Chest Pain  You may take 1 tablet every 5 minutes for up to 15 min (maximum of 3 tablets in 15 min.) If you have chest pain place 1 tablet under your tongue, wait 5 min If chest pain continues place a 2nd tablet under your tongue, wait 5 min If chest pain  continues place a 3rd tablet under your tongue, wait 5 min If chest pain continues call 911   *If you need a refill on your cardiac medications before your next appointment, please call your pharmacy*   Lab Work: NONE If you have labs (blood work) drawn today and your tests are completely normal, you will receive your results only by: MyChart Message (if you have MyChart) OR A paper copy in the mail If you have any lab test that is abnormal or we need to change your treatment, we will call you to review the results.   Testing/Procedures: NONE   Follow-Up: At [facility], you and your health needs are our priority.  As part of our continuing mission to provide you with exceptional heart care, we have created designated Provider Care Teams.  These Care Teams include your primary Cardiologist (physician) and Advanced Practice Providers (APPs -  Physician Assistants and Nurse Practitioners) who all work together to provide you with the care you need, when you need it.  We recommend signing up for the patient portal called "MyChart".  Sign up information is provided on this After Visit Summary.  MyChart is used to connect with patients for Virtual Visits (Telemedicine).  Patients are able to view lab/test results, encounter notes, upcoming appointments, etc.  Non-urgent messages can be sent to your provider as well.   To learn more about what you can do with MyChart, go to [email or web address].    Your next appointment:   5-6 month(s)  Provider:   Jari Favre, PA-C        Signed, Christell Constant, MD  07/31/2022 9:51 AM    [facility]

## 2022-07-31 NOTE — Patient Instructions (Signed)
Medication Instructions:  Your physician has recommended you make the following change in your medication:  INCREASE: Omeprazole (Prilosec) to 20 mg by mouth twice daily  DECREASE: atenolol (Tenormin) to 25 mg by mouth once daily  START: Nitroglycerin 0.4 mg under your tongue as needed for Chest Pain  You may take 1 tablet every 5 minutes for up to 15 min (maximum of 3 tablets in 15 min.) If you have chest pain place 1 tablet under your tongue, wait 5 min If chest pain continues place a 2nd tablet under your tongue, wait 5 min If chest pain continues place a 3rd tablet under your tongue, wait 5 min If chest pain continues call 911   *If you need a refill on your cardiac medications before your next appointment, please call your pharmacy*   Lab Work: NONE If you have labs (blood work) drawn today and your tests are completely normal, you will receive your results only by: MyChart Message (if you have MyChart) OR A paper copy in the mail If you have any lab test that is abnormal or we need to change your treatment, we will call you to review the results.   Testing/Procedures: NONE   Follow-Up: At [facility], you and your health needs are our priority.  As part of our continuing mission to provide you with exceptional heart care, we have created designated Provider Care Teams.  These Care Teams include your primary Cardiologist (physician) and Advanced Practice Providers (APPs -  Physician Assistants and Nurse Practitioners) who all work together to provide you with the care you need, when you need it.  We recommend signing up for the patient portal called "MyChart".  Sign up information is provided on this After Visit Summary.  MyChart is used to connect with patients for Virtual Visits (Telemedicine).  Patients are able to view lab/test results, encounter notes, upcoming appointments, etc.  Non-urgent messages can be sent to your provider as well.   To learn more about what  you can do with MyChart, go to [email or web address].    Your next appointment:   5-6 month(s)  Provider:   Jari Favre, PA-C

## 2022-08-19 DIAGNOSIS — R195 Other fecal abnormalities: Secondary | ICD-10-CM | POA: Diagnosis not present

## 2022-08-19 DIAGNOSIS — R252 Cramp and spasm: Secondary | ICD-10-CM | POA: Diagnosis not present

## 2022-08-19 DIAGNOSIS — R001 Bradycardia, unspecified: Secondary | ICD-10-CM | POA: Diagnosis not present

## 2022-08-19 DIAGNOSIS — K30 Functional dyspepsia: Secondary | ICD-10-CM | POA: Diagnosis not present

## 2022-08-19 DIAGNOSIS — R5383 Other fatigue: Secondary | ICD-10-CM | POA: Diagnosis not present

## 2022-08-21 DIAGNOSIS — R5383 Other fatigue: Secondary | ICD-10-CM | POA: Diagnosis not present

## 2022-08-21 DIAGNOSIS — R001 Bradycardia, unspecified: Secondary | ICD-10-CM | POA: Diagnosis not present

## 2022-08-21 DIAGNOSIS — R252 Cramp and spasm: Secondary | ICD-10-CM | POA: Diagnosis not present

## 2022-08-21 DIAGNOSIS — R195 Other fecal abnormalities: Secondary | ICD-10-CM | POA: Diagnosis not present

## 2022-10-08 DIAGNOSIS — N319 Neuromuscular dysfunction of bladder, unspecified: Secondary | ICD-10-CM | POA: Diagnosis not present

## 2022-10-08 DIAGNOSIS — I739 Peripheral vascular disease, unspecified: Secondary | ICD-10-CM | POA: Diagnosis not present

## 2022-10-08 DIAGNOSIS — Z7189 Other specified counseling: Secondary | ICD-10-CM | POA: Diagnosis not present

## 2022-10-08 DIAGNOSIS — I10 Essential (primary) hypertension: Secondary | ICD-10-CM | POA: Diagnosis not present

## 2022-10-08 DIAGNOSIS — I2510 Atherosclerotic heart disease of native coronary artery without angina pectoris: Secondary | ICD-10-CM | POA: Diagnosis not present

## 2022-10-08 DIAGNOSIS — K219 Gastro-esophageal reflux disease without esophagitis: Secondary | ICD-10-CM | POA: Diagnosis not present

## 2022-11-16 DIAGNOSIS — E039 Hypothyroidism, unspecified: Secondary | ICD-10-CM | POA: Diagnosis not present

## 2022-11-16 DIAGNOSIS — M6281 Muscle weakness (generalized): Secondary | ICD-10-CM | POA: Diagnosis not present

## 2022-11-16 DIAGNOSIS — K219 Gastro-esophageal reflux disease without esophagitis: Secondary | ICD-10-CM | POA: Diagnosis not present

## 2022-11-16 DIAGNOSIS — I10 Essential (primary) hypertension: Secondary | ICD-10-CM | POA: Diagnosis not present

## 2022-12-07 DIAGNOSIS — I2510 Atherosclerotic heart disease of native coronary artery without angina pectoris: Secondary | ICD-10-CM | POA: Diagnosis not present

## 2022-12-07 DIAGNOSIS — Q059 Spina bifida, unspecified: Secondary | ICD-10-CM | POA: Diagnosis not present

## 2022-12-07 DIAGNOSIS — I739 Peripheral vascular disease, unspecified: Secondary | ICD-10-CM | POA: Diagnosis not present

## 2022-12-07 DIAGNOSIS — E039 Hypothyroidism, unspecified: Secondary | ICD-10-CM | POA: Diagnosis not present

## 2022-12-07 DIAGNOSIS — E663 Overweight: Secondary | ICD-10-CM | POA: Diagnosis not present

## 2022-12-07 DIAGNOSIS — E785 Hyperlipidemia, unspecified: Secondary | ICD-10-CM | POA: Diagnosis not present

## 2022-12-07 DIAGNOSIS — N3289 Other specified disorders of bladder: Secondary | ICD-10-CM | POA: Diagnosis not present

## 2022-12-07 DIAGNOSIS — Z993 Dependence on wheelchair: Secondary | ICD-10-CM | POA: Diagnosis not present

## 2022-12-07 DIAGNOSIS — I10 Essential (primary) hypertension: Secondary | ICD-10-CM | POA: Diagnosis not present

## 2022-12-07 DIAGNOSIS — N319 Neuromuscular dysfunction of bladder, unspecified: Secondary | ICD-10-CM | POA: Diagnosis not present

## 2022-12-07 DIAGNOSIS — Z85828 Personal history of other malignant neoplasm of skin: Secondary | ICD-10-CM | POA: Diagnosis not present

## 2022-12-07 DIAGNOSIS — M1990 Unspecified osteoarthritis, unspecified site: Secondary | ICD-10-CM | POA: Diagnosis not present

## 2022-12-07 DIAGNOSIS — K219 Gastro-esophageal reflux disease without esophagitis: Secondary | ICD-10-CM | POA: Diagnosis not present

## 2022-12-07 DIAGNOSIS — Z982 Presence of cerebrospinal fluid drainage device: Secondary | ICD-10-CM | POA: Diagnosis not present

## 2022-12-13 DIAGNOSIS — I739 Peripheral vascular disease, unspecified: Secondary | ICD-10-CM | POA: Diagnosis not present

## 2022-12-13 DIAGNOSIS — I2510 Atherosclerotic heart disease of native coronary artery without angina pectoris: Secondary | ICD-10-CM | POA: Diagnosis not present

## 2022-12-13 DIAGNOSIS — K219 Gastro-esophageal reflux disease without esophagitis: Secondary | ICD-10-CM | POA: Diagnosis not present

## 2022-12-13 DIAGNOSIS — I10 Essential (primary) hypertension: Secondary | ICD-10-CM | POA: Diagnosis not present

## 2022-12-13 DIAGNOSIS — Q059 Spina bifida, unspecified: Secondary | ICD-10-CM | POA: Diagnosis not present

## 2022-12-13 DIAGNOSIS — M1990 Unspecified osteoarthritis, unspecified site: Secondary | ICD-10-CM | POA: Diagnosis not present

## 2022-12-19 DIAGNOSIS — I10 Essential (primary) hypertension: Secondary | ICD-10-CM | POA: Diagnosis not present

## 2022-12-20 DIAGNOSIS — Q059 Spina bifida, unspecified: Secondary | ICD-10-CM | POA: Diagnosis not present

## 2022-12-20 DIAGNOSIS — I739 Peripheral vascular disease, unspecified: Secondary | ICD-10-CM | POA: Diagnosis not present

## 2022-12-20 DIAGNOSIS — I2510 Atherosclerotic heart disease of native coronary artery without angina pectoris: Secondary | ICD-10-CM | POA: Diagnosis not present

## 2022-12-20 DIAGNOSIS — K219 Gastro-esophageal reflux disease without esophagitis: Secondary | ICD-10-CM | POA: Diagnosis not present

## 2022-12-20 DIAGNOSIS — I10 Essential (primary) hypertension: Secondary | ICD-10-CM | POA: Diagnosis not present

## 2022-12-20 DIAGNOSIS — M1990 Unspecified osteoarthritis, unspecified site: Secondary | ICD-10-CM | POA: Diagnosis not present

## 2022-12-23 DIAGNOSIS — I739 Peripheral vascular disease, unspecified: Secondary | ICD-10-CM | POA: Diagnosis not present

## 2022-12-23 DIAGNOSIS — I10 Essential (primary) hypertension: Secondary | ICD-10-CM | POA: Diagnosis not present

## 2022-12-23 DIAGNOSIS — K219 Gastro-esophageal reflux disease without esophagitis: Secondary | ICD-10-CM | POA: Diagnosis not present

## 2022-12-23 DIAGNOSIS — Q059 Spina bifida, unspecified: Secondary | ICD-10-CM | POA: Diagnosis not present

## 2022-12-23 DIAGNOSIS — I2510 Atherosclerotic heart disease of native coronary artery without angina pectoris: Secondary | ICD-10-CM | POA: Diagnosis not present

## 2022-12-23 DIAGNOSIS — M1990 Unspecified osteoarthritis, unspecified site: Secondary | ICD-10-CM | POA: Diagnosis not present

## 2023-01-02 DIAGNOSIS — I739 Peripheral vascular disease, unspecified: Secondary | ICD-10-CM | POA: Diagnosis not present

## 2023-01-02 DIAGNOSIS — Q059 Spina bifida, unspecified: Secondary | ICD-10-CM | POA: Diagnosis not present

## 2023-01-02 DIAGNOSIS — M1990 Unspecified osteoarthritis, unspecified site: Secondary | ICD-10-CM | POA: Diagnosis not present

## 2023-01-02 DIAGNOSIS — I10 Essential (primary) hypertension: Secondary | ICD-10-CM | POA: Diagnosis not present

## 2023-01-02 DIAGNOSIS — I2510 Atherosclerotic heart disease of native coronary artery without angina pectoris: Secondary | ICD-10-CM | POA: Diagnosis not present

## 2023-01-02 DIAGNOSIS — K219 Gastro-esophageal reflux disease without esophagitis: Secondary | ICD-10-CM | POA: Diagnosis not present

## 2023-04-30 ENCOUNTER — Other Ambulatory Visit: Payer: Self-pay | Admitting: Internal Medicine

## 2023-10-02 ENCOUNTER — Telehealth: Payer: Self-pay | Admitting: Internal Medicine

## 2023-10-02 DIAGNOSIS — Z5971 Insufficient health insurance coverage: Secondary | ICD-10-CM

## 2023-10-02 NOTE — Telephone Encounter (Signed)
 Pt reports swelling in legs and feet for a while. He is disabled and uses a wheelchair.   Informed patient that he needs to come in for an appointment, it has been over a year since he has been seen.  He reports that he has bowel incontinence (cannot sit in waiting room with bowels releasing unavoidably- also starts to come out when he moves around) and the GI provider said he needs a colonoscopy- but he has to wait to get that done until he can get enrolled in [facility] or PPO to be able to cover a colonoscopy- but enrollment starts in October and then it cannot be used until January.   [facility] is his PCP and they come out to see him, but have only done compression hose and told him to elevate legs and have not prescribed him anything else to help with the swelling.  Informed him that we do not have recent lab work and we have not seen him, and I doubt that the provider will be able to do anything unless he is able to come in to be seen. Instructed him to call [facility] and tell them he needs to be seen for his swelling and ask for medication to help decrease swelling.   Told him that I will send this to the provider and call back with any recommendations. He verbalized understanding.

## 2023-10-02 NOTE — Telephone Encounter (Signed)
 Pt c/o medication issue:  1. Name of Medication:   atenolol  (TENORMIN ) 25 MG tablet  rosuvastatin (CRESTOR) 10 MG tablet   2. How are you currently taking this medication (dosage and times per day)? Pt is not taking medication  3. Are you having a reaction (difficulty breathing--STAT)? No  4. What is your medication issue? Pts home health provider advised him to discontinue taking the above medications. They seem to think the medications may be causing his swelling. He is still having some severe swelling in his feet and ankles, has not been able to wear shoes for several months now. Pt thinks his swelling is from accident he had in ill fitted wheelchair. Please advise.   Pt c/o swelling/edema: STAT if pt has developed SOB within 24 hours  If swelling, where is the swelling located? Ankles and legs   How much weight have you gained and in what time span? No  Have you gained 2 pounds in a day or 5 pounds in a week? N/a  Do you have a log of your daily weights (if so, list)? No  Are you currently taking a fluid pill? yes  Are you currently SOB? No   Have you traveled recently in a car or plane for an extended period of time? No

## 2023-10-03 ENCOUNTER — Telehealth (HOSPITAL_COMMUNITY): Payer: Self-pay | Admitting: Licensed Clinical Social Worker

## 2023-10-03 NOTE — Progress Notes (Signed)
 Heart and Vascular Care Navigation  10/03/2023  Kenneth Arroyo 1960-03-11 [id number]  Reason for Referral: general social concerns   Engaged with patient by telephone for initial visit for Heart and Vascular Care Coordination.                                                                                                   Assessment:   CSW called pt to check in after he called clinic to discuss fluid retention.  Pt was unable to come in for appt due to incontinence concerns and had some barriers to care from insurance standpoint.  Pt reports he has been referred by his in home PCP through [facility] to see GI for colonoscopy to evaluate rectal bleeding but he hasn't set up as he would have to pay 20% since he has traditional [employer].  Has spoken to a broker and plans to enroll in managed plan but won't be active until January.  Encouraged pt to consider payment plan for bill as his labs indicate he is losing blood and he is concerned about possible cancer based on some previous skin cancer diagnosis.  Pt makes about $1,400 a month from disability but gets money taken out for part D insurance so only gets $1,213/month in his check.  CSW assisted pt in applying for Extra Help program to hopefully have his part D premium waived.  Possibly won't qualify as he has about $20,000 in savings.  [facility] is assisting pt in applying for CAP program to get in home assistance.  CSW spoke with this case worker who informed CSW that pt would likely have monthly deductible as he is over the Medicaid income level of about $1,300.  States that even though he is barely over he would owe about $1,200 a month in deductible to be set up with in home care.  Pt has no other in home care and does everything by himself.  Gets food through food stamps and gets delivery of food 2x a month through [employer].                                   HRT/VAS Care Coordination     Living arrangements for the past 2 months  Single Family Home   Lives with: Siblings   Home Assistive Devices/Equipment Wheelchair; Eyeglasses   DME Agency [employer]   Current home services DME  w/c       Social History:                                                                             SDOH Screenings   Food Insecurity: No Food Insecurity (10/03/2023)  Housing: Unknown (10/03/2023)  Transportation Needs: No Transportation Needs (10/03/2023)  Financial  Resource Strain: High Risk (10/03/2023)  Tobacco Use: Low Risk  (07/31/2022)    SDOH Interventions: Financial Resources:    Receives about $1,400/month from [employer] after Information systems manager Insecurity:  Food Insecurity Interventions: Intervention Not Indicated (receives food stamps)  Housing Insecurity:  Housing Interventions: Intervention Not Indicated  Transportation:   Transportation Interventions: Intervention Not Indicated    Follow-up plan:    Unfortunately limited resources available to assist pt at this time.  Encouraged him to consider seeking treatment now and utilizing payment plan for bill so he will not delay necessary medical treatment.  Otherwise he plans to get managed Medicare and would have more extensive coverage starting in January.  Kenneth HILARIO Leech, LCSW Clinical Social Worker [facility] Desk#: [phone] Cell#: [phone]

## 2023-10-03 NOTE — Telephone Encounter (Signed)
 Left a message to call back.

## 2023-10-15 NOTE — Telephone Encounter (Signed)
 Left a message to call back.

## 2023-12-30 NOTE — Telephone Encounter (Signed)
 Left a message to call back.  3rd attempt to reach pt closing this encounter.

## 2023-12-30 NOTE — Telephone Encounter (Signed)
 Pt reports bilateral swelling to feet ongoing for months.  Currently has in home health services through [facility].    Advised pt MD can't treat without an office visit.  Advised pt to have Provider with [facility] assess concern.  Pt reports NP has tried but unable to get swelling down.  Advise pt to ask providers what the plan is moving forward.   Pt expresses needs to have a colonoscopy but can't afford co pay.  Reports diarrhea is unpredictable and would not want to come in for OV and have an accident and be unable to clean self up.   Advised pt to contact our office once able to physically come into the office.  Advised pt to address concern with [facility] Providers. Pt thanked me for call.
